# Patient Record
Sex: Female | Born: 1965 | Race: Black or African American | Hispanic: No | Marital: Single | Smoking: Never smoker
Health system: Southern US, Community
[De-identification: ages and names within clinical notes are randomized; demographics above are authoritative.]

## PROBLEM LIST (undated history)

## (undated) DIAGNOSIS — G459 Transient cerebral ischemic attack, unspecified: Secondary | ICD-10-CM

## (undated) DIAGNOSIS — E049 Nontoxic goiter, unspecified: Secondary | ICD-10-CM

## (undated) DIAGNOSIS — J45909 Unspecified asthma, uncomplicated: Secondary | ICD-10-CM

## (undated) HISTORY — PX: EYE SURGERY: SHX253

## (undated) HISTORY — PX: BREAST CYST EXCISION: SHX579

## (undated) HISTORY — PX: ABLATION: SHX5711

## (undated) HISTORY — PX: TUBAL LIGATION: SHX77

---
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
## 2004-02-23 ENCOUNTER — Encounter (INDEPENDENT_AMBULATORY_CARE_PROVIDER_SITE_OTHER): Payer: Self-pay | Admitting: Internal Medicine

## 2004-02-23 ENCOUNTER — Ambulatory Visit (HOSPITAL_COMMUNITY): Admission: RE | Admit: 2004-02-23 | Discharge: 2004-02-23 | Payer: Self-pay | Admitting: Obstetrics

## 2004-07-24 ENCOUNTER — Emergency Department (HOSPITAL_COMMUNITY): Admission: EM | Admit: 2004-07-24 | Discharge: 2004-07-24 | Payer: Self-pay | Admitting: Emergency Medicine

## 2005-02-08 ENCOUNTER — Emergency Department (HOSPITAL_COMMUNITY): Admission: EM | Admit: 2005-02-08 | Discharge: 2005-02-08 | Payer: Self-pay | Admitting: Emergency Medicine

## 2006-06-28 ENCOUNTER — Emergency Department (HOSPITAL_COMMUNITY): Admission: EM | Admit: 2006-06-28 | Discharge: 2006-06-28 | Payer: Self-pay | Admitting: Emergency Medicine

## 2007-04-02 ENCOUNTER — Emergency Department (HOSPITAL_COMMUNITY): Admission: EM | Admit: 2007-04-02 | Discharge: 2007-04-02 | Payer: Self-pay | Admitting: Family Medicine

## 2007-07-23 ENCOUNTER — Inpatient Hospital Stay (HOSPITAL_COMMUNITY): Admission: EM | Admit: 2007-07-23 | Discharge: 2007-07-26 | Payer: Self-pay | Admitting: Emergency Medicine

## 2007-07-23 ENCOUNTER — Ambulatory Visit: Payer: Self-pay | Admitting: Infectious Disease

## 2007-07-24 ENCOUNTER — Encounter: Payer: Self-pay | Admitting: Infectious Disease

## 2007-07-26 ENCOUNTER — Encounter: Payer: Self-pay | Admitting: Infectious Disease

## 2007-08-09 ENCOUNTER — Ambulatory Visit: Payer: Self-pay | Admitting: *Deleted

## 2007-08-09 DIAGNOSIS — R0602 Shortness of breath: Secondary | ICD-10-CM | POA: Insufficient documentation

## 2007-08-09 DIAGNOSIS — E785 Hyperlipidemia, unspecified: Secondary | ICD-10-CM | POA: Insufficient documentation

## 2007-11-18 ENCOUNTER — Encounter (INDEPENDENT_AMBULATORY_CARE_PROVIDER_SITE_OTHER): Payer: Self-pay | Admitting: Internal Medicine

## 2007-11-18 ENCOUNTER — Ambulatory Visit: Payer: Self-pay | Admitting: *Deleted

## 2007-11-18 ENCOUNTER — Ambulatory Visit (HOSPITAL_COMMUNITY): Admission: RE | Admit: 2007-11-18 | Discharge: 2007-11-18 | Payer: Self-pay | Admitting: *Deleted

## 2007-11-18 DIAGNOSIS — R079 Chest pain, unspecified: Secondary | ICD-10-CM

## 2007-11-18 DIAGNOSIS — F411 Generalized anxiety disorder: Secondary | ICD-10-CM

## 2008-04-14 ENCOUNTER — Ambulatory Visit: Payer: Self-pay | Admitting: Internal Medicine

## 2008-04-14 ENCOUNTER — Encounter (INDEPENDENT_AMBULATORY_CARE_PROVIDER_SITE_OTHER): Payer: Self-pay | Admitting: Internal Medicine

## 2008-04-14 ENCOUNTER — Encounter: Payer: Self-pay | Admitting: Internal Medicine

## 2008-04-14 DIAGNOSIS — M549 Dorsalgia, unspecified: Secondary | ICD-10-CM | POA: Insufficient documentation

## 2008-04-14 DIAGNOSIS — N912 Amenorrhea, unspecified: Secondary | ICD-10-CM

## 2008-04-17 LAB — CONVERTED CEMR LAB
Cholesterol: 117 mg/dL (ref 0–200)
HDL: 48 mg/dL (ref >39)
LDL Cholesterol: 56 mg/dL (ref 0–99)
Triglycerides: 65 mg/dL (ref <150)
VLDL: 13 mg/dL (ref 0–40)

## 2008-05-25 ENCOUNTER — Ambulatory Visit: Payer: Self-pay | Admitting: Internal Medicine

## 2008-05-25 ENCOUNTER — Telehealth: Payer: Self-pay | Admitting: *Deleted

## 2008-05-25 DIAGNOSIS — R109 Unspecified abdominal pain: Secondary | ICD-10-CM

## 2009-02-14 ENCOUNTER — Emergency Department (HOSPITAL_COMMUNITY): Admission: EM | Admit: 2009-02-14 | Discharge: 2009-02-14 | Payer: Self-pay | Admitting: Emergency Medicine

## 2009-09-04 ENCOUNTER — Ambulatory Visit: Payer: Self-pay | Admitting: Internal Medicine

## 2009-09-04 DIAGNOSIS — R609 Edema, unspecified: Secondary | ICD-10-CM | POA: Insufficient documentation

## 2009-09-04 DIAGNOSIS — Z862 Personal history of diseases of the blood and blood-forming organs and certain disorders involving the immune mechanism: Secondary | ICD-10-CM

## 2009-09-13 ENCOUNTER — Ambulatory Visit (HOSPITAL_COMMUNITY): Admission: RE | Admit: 2009-09-13 | Discharge: 2009-09-13 | Payer: Self-pay | Admitting: Internal Medicine

## 2009-12-27 ENCOUNTER — Emergency Department (HOSPITAL_COMMUNITY): Admission: EM | Admit: 2009-12-27 | Discharge: 2009-12-27 | Payer: Self-pay | Admitting: Emergency Medicine

## 2010-02-21 ENCOUNTER — Emergency Department (HOSPITAL_COMMUNITY)
Admission: EM | Admit: 2010-02-21 | Discharge: 2010-02-21 | Payer: Self-pay | Source: Home / Self Care | Admitting: Family Medicine

## 2010-02-21 LAB — CBC
HCT: 40.4 % (ref 36.0–46.0)
Hemoglobin: 13.1 g/dL (ref 12.0–15.0)
MCH: 30.8 pg (ref 26.0–34.0)
MCHC: 32.4 g/dL (ref 30.0–36.0)
MCV: 95.1 fL (ref 78.0–100.0)
Platelets: 252 10*3/uL (ref 150–400)
RBC: 4.25 MIL/uL (ref 3.87–5.11)
RDW: 11.8 % (ref 11.5–15.5)
WBC: 5.1 10*3/uL (ref 4.0–10.5)

## 2010-02-21 LAB — POCT I-STAT, CHEM 8
BUN: <3 mg/dL — ABNORMAL LOW (ref 6–23)
Calcium, Ion: 1.34 mmol/L — ABNORMAL HIGH (ref 1.12–1.32)
Chloride: 103 mEq/L (ref 96–112)
Creatinine, Ser: 0.9 mg/dL (ref 0.4–1.2)
Glucose, Bld: 80 mg/dL (ref 70–99)
HCT: 45 % (ref 36.0–46.0)
Hemoglobin: 15.3 g/dL — ABNORMAL HIGH (ref 12.0–15.0)
Potassium: 4.2 mEq/L (ref 3.5–5.1)
Sodium: 139 mEq/L (ref 135–145)
TCO2: 30 mmol/L (ref 0–100)

## 2010-02-21 LAB — DIFFERENTIAL
Basophils Absolute: 0 10*3/uL (ref 0.0–0.1)
Basophils Relative: 1 % (ref 0–1)
Eosinophils Absolute: 0.2 10*3/uL (ref 0.0–0.7)
Eosinophils Relative: 4 % (ref 0–5)
Lymphocytes Relative: 34 % (ref 12–46)
Lymphs Abs: 1.7 10*3/uL (ref 0.7–4.0)
Monocytes Absolute: 0.4 10*3/uL (ref 0.1–1.0)
Monocytes Relative: 8 % (ref 3–12)
Neutro Abs: 2.8 10*3/uL (ref 1.7–7.7)
Neutrophils Relative %: 53 % (ref 43–77)

## 2010-02-21 LAB — SEDIMENTATION RATE: Sed Rate: 7 mm/hr (ref 0–22)

## 2010-02-21 LAB — TSH: TSH: 1.57 u[IU]/mL (ref 0.350–4.500)

## 2010-02-21 LAB — T4: T4, Total: 9.9 ug/dL (ref 5.0–12.5)

## 2010-02-22 ENCOUNTER — Encounter: Payer: Self-pay | Admitting: Internal Medicine

## 2010-02-22 ENCOUNTER — Inpatient Hospital Stay (HOSPITAL_COMMUNITY)
Admission: EM | Admit: 2010-02-22 | Discharge: 2010-02-26 | Payer: Self-pay | Source: Home / Self Care | Attending: Internal Medicine | Admitting: Internal Medicine

## 2010-02-22 LAB — DIFFERENTIAL
Basophils Absolute: 0 10*3/uL (ref 0.0–0.1)
Basophils Relative: 0 % (ref 0–1)
Eosinophils Absolute: 0.2 10*3/uL (ref 0.0–0.7)
Eosinophils Relative: 3 % (ref 0–5)
Lymphocytes Relative: 16 % (ref 12–46)
Lymphs Abs: 1.1 10*3/uL (ref 0.7–4.0)
Monocytes Absolute: 0.6 10*3/uL (ref 0.1–1.0)
Monocytes Relative: 9 % (ref 3–12)
Neutro Abs: 4.9 10*3/uL (ref 1.7–7.7)
Neutrophils Relative %: 73 % (ref 43–77)

## 2010-02-22 LAB — BASIC METABOLIC PANEL
BUN: 7 mg/dL (ref 6–23)
CO2: 27 mEq/L (ref 19–32)
Calcium: 8.6 mg/dL (ref 8.4–10.5)
Chloride: 102 mEq/L (ref 96–112)
Creatinine, Ser: 1.09 mg/dL (ref 0.4–1.2)
GFR calc Af Amer: >60 mL/min (ref >60)
GFR calc non Af Amer: 55 mL/min — ABNORMAL LOW (ref >60)
Glucose, Bld: 92 mg/dL (ref 70–99)
Potassium: 3.9 mEq/L (ref 3.5–5.1)
Sodium: 137 mEq/L (ref 135–145)

## 2010-02-22 LAB — CBC
HCT: 37.4 % (ref 36.0–46.0)
Hemoglobin: 12.1 g/dL (ref 12.0–15.0)
MCH: 30.8 pg (ref 26.0–34.0)
MCHC: 32.4 g/dL (ref 30.0–36.0)
MCV: 95.2 fL (ref 78.0–100.0)
Platelets: 200 10*3/uL (ref 150–400)
RBC: 3.93 MIL/uL (ref 3.87–5.11)
RDW: 11.7 % (ref 11.5–15.5)
WBC: 6.7 10*3/uL (ref 4.0–10.5)

## 2010-02-22 LAB — TSH: TSH: 1.171 u[IU]/mL (ref 0.350–4.500)

## 2010-02-22 LAB — SEDIMENTATION RATE: Sed Rate: 7 mm/hr (ref 0–22)

## 2010-02-22 LAB — T4, FREE: Free T4: 1.04 ng/dL (ref 0.80–1.80)

## 2010-02-22 LAB — T4: T4, Total: 8.1 ug/dL (ref 5.0–12.5)

## 2010-02-26 ENCOUNTER — Ambulatory Visit: Admit: 2010-02-26 | Payer: Self-pay

## 2010-02-26 ENCOUNTER — Encounter: Payer: Self-pay | Admitting: Ophthalmology

## 2010-02-26 DIAGNOSIS — E049 Nontoxic goiter, unspecified: Secondary | ICD-10-CM | POA: Insufficient documentation

## 2010-03-04 LAB — BASIC METABOLIC PANEL
BUN: 5 mg/dL — ABNORMAL LOW (ref 6–23)
BUN: 3 mg/dL — ABNORMAL LOW (ref 6–23)
CO2: 27 mEq/L (ref 19–32)
CO2: 26 mEq/L (ref 19–32)
Calcium: 7.7 mg/dL — ABNORMAL LOW (ref 8.4–10.5)
Calcium: 8.3 mg/dL — ABNORMAL LOW (ref 8.4–10.5)
Chloride: 104 mEq/L (ref 96–112)
Chloride: 103 mEq/L (ref 96–112)
Creatinine, Ser: 0.98 mg/dL (ref 0.4–1.2)
Creatinine, Ser: 0.84 mg/dL (ref 0.4–1.2)
GFR calc Af Amer: >60 mL/min (ref >60)
GFR calc Af Amer: >60 mL/min (ref >60)
GFR calc non Af Amer: >60 mL/min (ref >60)
GFR calc non Af Amer: >60 mL/min (ref >60)
Glucose, Bld: 90 mg/dL (ref 70–99)
Glucose, Bld: 119 mg/dL — ABNORMAL HIGH (ref 70–99)
Potassium: 3.7 mEq/L (ref 3.5–5.1)
Potassium: 3.6 mEq/L (ref 3.5–5.1)
Sodium: 138 mEq/L (ref 135–145)
Sodium: 137 mEq/L (ref 135–145)

## 2010-03-04 LAB — CBC
HCT: 32.7 % — ABNORMAL LOW (ref 36.0–46.0)
HCT: 34.2 % — ABNORMAL LOW (ref 36.0–46.0)
Hemoglobin: 10.7 g/dL — ABNORMAL LOW (ref 12.0–15.0)
Hemoglobin: 11 g/dL — ABNORMAL LOW (ref 12.0–15.0)
MCH: 30.9 pg (ref 26.0–34.0)
MCH: 30.1 pg (ref 26.0–34.0)
MCHC: 32.7 g/dL (ref 30.0–36.0)
MCHC: 32.2 g/dL (ref 30.0–36.0)
MCV: 94.5 fL (ref 78.0–100.0)
MCV: 93.7 fL (ref 78.0–100.0)
Platelets: 203 10*3/uL (ref 150–400)
Platelets: 214 10*3/uL (ref 150–400)
RBC: 3.46 MIL/uL — ABNORMAL LOW (ref 3.87–5.11)
RBC: 3.65 MIL/uL — ABNORMAL LOW (ref 3.87–5.11)
RDW: 11.7 % (ref 11.5–15.5)
RDW: 11.3 % — ABNORMAL LOW (ref 11.5–15.5)
WBC: 5.6 10*3/uL (ref 4.0–10.5)
WBC: 3.5 10*3/uL — ABNORMAL LOW (ref 4.0–10.5)

## 2010-03-04 LAB — HIV ANTIBODY (ROUTINE TESTING W REFLEX): HIV: NONREACTIVE

## 2010-03-05 ENCOUNTER — Other Ambulatory Visit
Admission: RE | Admit: 2010-03-05 | Discharge: 2010-03-05 | Payer: Self-pay | Source: Home / Self Care | Admitting: Otolaryngology

## 2010-03-05 ENCOUNTER — Other Ambulatory Visit: Payer: Self-pay | Admitting: Otolaryngology

## 2010-03-12 ENCOUNTER — Ambulatory Visit: Admit: 2010-03-12 | Payer: Self-pay

## 2010-03-19 NOTE — Assessment & Plan Note (Signed)
Summary: REASSIGNED NEW TO DR/CFB   Vital Signs:  Patient profile:   45 year old female Height:      68 inches (172.72 cm) Weight:      155.2 pounds (70.55 kg) BMI:     23.68 Temp:     98.2 degrees F oral Pulse rate:   66 / minute BP sitting:   112 / 73  (right arm)  Vitals Entered By: Chinita Pester RN (September 04, 2009 3:19 PM) CC: Regular check-up. Check bruises on legs; stays cold;ankle edema. Is Patient Diabetic? No Pain Assessment Patient in pain? no      Nutritional Status BMI of 19 -24 = normal  Have you ever been in a relationship where you felt threatened, hurt or afraid?No   Does patient need assistance? Functional Status Self care Ambulation Normal   Primary Care Provider:  Joaquin Courts  MD  CC:  Regular check-up. Check bruises on legs; stays cold;ankle edema..  History of Present Illness: 1. c/o increase bruising to her legs for 4-5 months. Patient does not report similar Sx in the past. No personal or FMHx of blood disorders. Denies sweats, weight loss, fatigue orany other Sx. 2. C/o ankle swelling, intermittent for 5 months; worse while at work with prolonged sitting. Denies any SOB, CP,palpitations,snoring, fever, chills or recent trips.  Depression History:      The patient denies a depressed mood most of the day and a diminished interest in her usual daily activities.         Preventive Screening-Counseling & Management  Alcohol-Tobacco     Alcohol drinks/day: rarely     Smoking Status: never  Caffeine-Diet-Exercise     Caffeine use/day: 2 cups coffee/day     Does Patient Exercise: yes- sometimes     Type of exercise: SWIM   / WALKING     Times/week: <3  Problems Prior to Update: 1)  Screening For Malignant Neoplasm of The Cervix  (ICD-V76.2) 2)  Anemia, Mild, Hx of  (ICD-V12.3) 3)  Leg Edema, Bilateral  (ICD-782.3) 4)  Pelvic Pain, Left  (ICD-789.09) 5)  Back Pain  (ICD-724.5) 6)  Amenorrhea  (ICD-626.0) 7)  Other Anxiety States   (ICD-300.09) 8)  Chest Pain  (ICD-786.50) 9)  Sob  (ICD-786.05) 10)  Hyperlipidemia  (ICD-272.4)  Current Problems (verified): 1)  Pelvic Pain, Left  (ICD-789.09) 2)  Back Pain  (ICD-724.5) 3)  Amenorrhea  (ICD-626.0) 4)  Other Anxiety States  (ICD-300.09) 5)  Chest Pain  (ICD-786.50) 6)  Sob  (ICD-786.05) 7)  Hyperlipidemia  (ICD-272.4)  Medications Prior to Update: 1)  Aspirin Adult Low Strength 81 Mg  Tbec (Aspirin) .... One Tablet By Mouth Daily. 2)  Klonopin 0.5 Mg  Tabs (Clonazepam) .... One Tablet By Mouth At Bedtime. 3)  One-A-Day Womens Prenatal 28-0.8 & 440 Mg Misc (Prenatal Vit-Fe Fum-Fa-Omega) .... Take 1 Tablet By Mouth Once A Day 4)  Multivitamins   Tabs (Multiple Vitamin) 5)  Ventolin Hfa 108 (90 Base) Mcg/act  Aers (Albuterol Sulfate) .... One Puff Every Four Hours As Needed. 6)  Oxycodone Hcl 5 Mg Tabs (Oxycodone Hcl) 7)  Ibu 800 Mg Tabs (Ibuprofen) 8)  Benefiber  Tabs (Wheat Dextrin) 9)  Percocet 5-325 Mg Tabs (Oxycodone-Acetaminophen) .... Take One Tablet By Mouth Every Six Hours As Needed  Current Medications (verified): 1)  One-A-Day Womens Prenatal 28-0.8 & 440 Mg Misc (Prenatal Vit-Fe Fum-Fa-Omega) .... Take 1 Tablet By Mouth Once A Day 2)  Jobst Knee High Compression Sm  Misc (Elastic Bandages & Supports) .... Use While At Work or Immobile  Allergies (verified): No Known Drug Allergies  Directives (verified): 1)  Full Code   Past History:  Past Medical History: Last updated: 11/18/2007 Hospitalized 06/09 for chest pain and neuro symptoms: Borderline EF on ECHO of 50-55%, neg CE's, neg tele.  F/u w/ Dr. Anne Fu revealed normal treadmill testing and no further testing indicated. Hx of asthma and bronchitis Hyperlipidemia Benign breast cyst  Past Surgical History: Last updated: 08/09/2007 Breast lumpectomy-benign BTL 17 years ago.  Risk Factors: Smoking Status: never (09/04/2009)  Family History: Reviewed history from 08/09/2007 and no changes  required. Son-unknown heart condition s/p transplant at 14 Mom-DM Dad-DM, htn, no hx of MI, CVA Sisters-No known problems Paternal side:lung CA;pancreatic cancer; brain cancer; breast cancer.  Social History: Reviewed history from 08/09/2007 and no changes required. No smoking, illegal drugs, one glass of gin/juice a week, works for [employer], divorced, lives with son.Does Patient Exercise:  yes- sometimes Caffeine use/day:  2 cups coffee/day  Review of Systems       per HPI  Physical Exam  General:  alert, well-developed, well-nourished, and well-hydrated.   Head:  Normocephalic and atraumatic without obvious abnormalities. No apparent alopecia or balding. Eyes:  No corneal or conjunctival inflammation noted. EOMI. Perrla. Funduscopic exam benign, without hemorrhages, exudates or papilledema. Vision grossly normal. Ears:  External ear exam shows no significant lesions or deformities.  Otoscopic examination reveals clear canals, tympanic membranes are intact bilaterally without bulging, retraction, inflammation or discharge. Hearing is grossly normal bilaterally. Nose:  External nasal examination shows no deformity or inflammation. Nasal mucosa are pink and moist without lesions or exudates. Mouth:  Oral mucosa and oropharynx without lesions or exudates.  Teeth in good repair. Neck:  No deformities, masses, or tenderness noted. Lungs:  Normal respiratory effort, chest expands symmetrically. Lungs are clear to auscultation, no crackles or wheezes. Heart:  Normal rate and regular rhythm. S1 and S2 normal without gallop, murmur, click, rub or other extra sounds. Abdomen:  Bowel sounds positive,abdomen soft and non-tender without masses, organomegaly or hernias noted. Msk:  No deformity or scoliosis noted of thoracic or lumbar spine.   Pulses:  R and L carotid,radial,femoral,dorsalis pedis and posterior tibial pulses are full and equal bilaterally Extremities:  1+ left pedal edema and 1+  right pedal edema.  No calf TTP bilaterally Neurologic:  No cranial nerve deficits noted. Station and gait are normal. Plantar reflexes are down-going bilaterally. DTRs are symmetrical throughout. Sensory, motor and coordinative functions appear intact. Skin:  Left UE ventral surface 0.8 cm in diameter round, hyperpigmented, papular lesion noted. Per patient, has had it for 6 months --no puriruts, no expansion, no pain. Cervical Nodes:  No lymphadenopathy noted Axillary Nodes:  No palpable lymphadenopathy Inguinal Nodes:  No significant adenopathy Psych:  Cognition and judgment appear intact. Alert and cooperative with normal attention span and concentration. No apparent delusions, illusions, hallucinations   Impression & Recommendations:  Problem # 1:  ANEMIA, MILD, HX OF (ICD-V12.3) Likely 2/2DUB. Patient is sp endometrial ablation.  Orders: T-CBC No Diff ([id number])  Problem # 2:  LEG EDEMA, BILATERAL (ICD-782.3)  Likely 2/2 venous insufficiency. Leg exercises demonstrated; compression knee highs and foot stool use while at work. Instructed to RTC if no improvment or any other Sx such as orthopnea, PND, CP or palpitations. Patient sees a cardiologist once a year given strong FMHx of CAD. She has an appointment for August, 2011. Instructed to address the matter  at that time as well. No objective findings of ecchymoses. Might be related to pedal edema. Will check CBC. Considerfull work up if problem recurrs orpersists. Patient does not have any FMHx of blood disorders.  Discussed elevation of the legs, use of compression stockings, sodium restiction, and medication use.   Problem # 3:  HYPERLIPIDEMIA (ICD-272.4) Diet, exercise discussed. Orders: T-Lipid Profile ([id number])    HDL:48 (04/14/2008)  LDL:56 (04/14/2008)  Chol:117 (04/14/2008)  Trig:65 (04/14/2008)  Problem # 4:  Preventive Health Care (ICD-V70.0) Mammogram, PAP , lipids, TSH, C-met ordered. Wear seat belt  Complete  Medication List: 1)  One-a-day Womens Prenatal 28-0.8 & 440 Mg Misc (Prenatal vit-fe fum-fa-omega) .... Take 1 tablet by mouth once a day 2)  Jobst Knee High Compression Sm Misc (Elastic bandages & supports) .... Use while at work or immobile  Other Orders: T-Comprehensive Metabolic Panel [id number]) T-TSH [id number]) Mammogram (Screening) (Mammo) Gynecologic Referral (Gyn) TD Toxoids IM 7 YR + (62952) Admin 1st Vaccine (84132)  Patient Instructions: 1)  Please, fol,low up with your cardiologist and discuss "water pill use" on as needed basis. 2)  If no improvement in leg swelling, please call. 3)  Call with any questions. 4)  Return fasting for blood work. 5)  Nurse will call you with your referral information. Prescriptions: JOBST KNEE HIGH COMPRESSION SM  MISC (ELASTIC BANDAGES & SUPPORTS) Use while at work or immobile  #1 x 3   Entered and Authorized by:   Deatra Robinson MD   Signed by:   Deatra Robinson MD on 09/04/2009   Method used:   Print then Give to Patient   RxID:   [id number]   Prevention & Chronic Care Immunizations   Influenza vaccine: Not documented   Influenza vaccine deferral: Deferred  (09/04/2009)   Influenza vaccine due: 10/18/2009    Tetanus booster: 09/04/2009: Td   Tetanus booster due: 09/05/2019    Pneumococcal vaccine: Not documented  Other Screening   Pap smear: Not documented   Pap smear action/deferral: GYN Referral  (09/04/2009)   Pap smear due: 09/04/2012    Mammogram: Not documented   Mammogram action/deferral: Ordered  (09/04/2009)   Smoking status: never  (09/04/2009)  Lipids   Total Cholesterol: 117  (04/14/2008)   LDL: 56  (04/14/2008)   LDL Direct: Not documented   HDL: 48  (04/14/2008)   Triglycerides: 65  (04/14/2008)    SGOT (AST): Not documented   SGPT (ALT): Not documented CMP ordered    Alkaline phosphatase: Not documented   Total bilirubin: Not documented  Self-Management Support :   Personal Goals  (by the next clinic visit) :      Personal LDL goal: 100  (09/04/2009)    Patient will work on the following items until the next clinic visit to reach self-care goals:     Medications and monitoring: bring all of my medications to every visit  (09/04/2009)     Activity: take a 30 minute walk every day  (09/04/2009)    Lipid self-management support: Written self-care plan, Education handout, Resources for patients handout  (09/04/2009)   Lipid self-care plan printed.   Lipid education handout printed      Resource handout printed.   Nursing Instructions: Give tetanus booster today Gyn referral for screening Pap (see order) Schedule screening mammogram (see order)   Process Orders Check Orders Results:     [facility]: ABN not required for this insurance Tests Sent for requisitioning (September 04, 2009 4:52 PM):  09/04/2009: [facility] -- T-Comprehensive Metabolic Panel [[id number]] (signed)     09/04/2009: [facility] -- T-Lipid Profile [phone] (signed)     09/04/2009: [facility] -- T-CBC No Diff [[id number]] (signed)     09/04/2009: [facility] -- T-TSH [phone] (signed)     Tetanus/Td Vaccine    Vaccine Type: Td    Site: right deltoid    Mfr: Sanofi Pasteur    Dose: 0.5 ml    Route: IM    Given by: Chinita Pester RN    Exp. Date: 03/15/2011    Lot #: [id number]    VIS given: 01/05/07 version given September 04, 2009.

## 2010-03-19 NOTE — Letter (Signed)
Summary: MAMMOGRAM  MAMMOGRAM   Imported By: Margie Billet 06/21/2009 11:27:43  _____________________________________________________________________  External Attachment:    Type:   Image     Comment:   External Document

## 2010-03-21 NOTE — Assessment & Plan Note (Signed)
Summary: Hospital Admission: 02/22/2010 - Dysphagia, pain in the neck  INTERNAL MEDICINE ADMISSION HISTORY AND PHYSICAL ATTENDING: DR. Onalee Hua R1- DR. Rhona Raider R3 - DR. [facility] [phone]  PCP: DR. Denton Meek  CC: PAIN IN THE NECK AND ODYNOPHAGIA  HPI: 45 yo female with no significant PMH presents to [facility] ED with main concern of 2-3 day history of painful neck associated with odynophagia to liquids and solids. She has first noticed symptoms while at church and thought it was going to improve for which reason she cont to go to work. She felt worse at work and went to urgent care yeaterday. The pain is located all around her nech, 10/10 in severity, nonradiating, constant, knife-like, aggrevated by any neck movement and with no specific alleviating factors. She has not had similar episodes in the past and denies any new medications on her profile, only take MVI. This is assocaited with painful swallowing, solids and liquids, for which reason she has been refusing to eat or drink over the past day. In urgent care they have given her pain medication and abx and told her to go home and complete the regimen. Pain medicine helped a little but she has not started abx due to her fear of trying to swallow more than she needs. She also reports chest pain worse with inspiration, nonradiating. In addition, she reports having flu like symptoms which lasted about 1 week and she felt better after that. Her fiance got alos sick with flu around the same time. She denies any SOB, no dysphagia or feeling of food being stuck in her throat, no sinus tenderness, no headaches or visual changes, no other systemic symptoms, no night sweats, no weight loss or gain, no heat or cold intolerance, no abdominal or urinary concerns,no blood in urine or stool, no weakness.  ALLERGIES: NKDA  PAST MEDICAL HISTORY: Hospitalized 06/09 for chest pain and neuro symptoms: Borderline EF on ECHO of 50-55%, neg CE's, neg tele.  F/u w/ Dr. Anne Fu  revealed normal treadmill testing and no further testing indicated. Hx of asthma and bronchitis Hyperlipidemia Benign breast cyst  MEDICATIONS: MVI ONCE DAILY  SOCIAL HISTORY: No smoking, illegal drugs, one glass of gin/juice a week, works for [employer], divorced, lives with son.  FAMILY HISTORY: Son-unknown heart condition s/p transplant at 62 Mom-DM Dad-DM, htn, no hx of MI, CVA Sisters-No known problems Paternal side:lung CA;pancreatic cancer; brain cancer; breast cancer.  ROS: PER HPI  VITALS: T:  98.6 f P: 117/MIN --> 79/MIN  BP: 130/10mmHg --> 84/57 mmHg   R: 16/MIN   O2SAT: 96% ON: RA  PHYSICAL EXAM: General:  alert, well-developed, well-nourished, and well-hydrated, in moderate distress due to pain, crying Head:  Normocephalic and atraumatic without obvious abnormalities. No apparent alopecia or balding. Eyes:  No corneal or conjunctival inflammation noted. EOMI. PERRLA. Funduscopic exam benign, without hemorrhages, exudates or papilledema. Vision grossly normal. Ears:  External ear exam shows no significant lesions or deformities.  Otoscopic examination reveals clear canals, tympanic membranes are intact bilaterally without bulging, retraction, inflammation or discharge. Hearing is grossly normal bilaterally. Nose:  External nasal examination shows no deformity or inflammation. Nasal mucosa are pink and moist without lesions or exudates. Mouth:  Oral mucosa and oropharynx without lesions or exudates or erythema.  Good dental care, dry MM Neck:  generalized neck tenderness and swelling, left > right, no bruits appreciated, unable to complete exam due to patient's pain Lungs:  Normal respiratory effort, chest expands symmetrically. Lungs are clear to auscultation, no  crackles or wheezes. Heart:  Normal rate and regular rhythm. S1 and S2 normal without gallop, murmur, click, rub or other extra sounds. Off note was tachycardic earlier. Abdomen:  Bowel sounds positive,abdomen  soft and non-tender without masses, organomegaly or hernias noted. Msk:  No deformity or scoliosis noted of thoracic or lumbar spine.   Pulses:  R and L carotid,radial,femoral,dorsalis pedis and posterior tibial pulses are full and equal bilaterally Extremities:  1+ left pedal edema and 1+ right pedal edema.  No calf TTP bilaterally Neurologic:  No cranial nerve deficits noted. Station and gait are normal. Plantar reflexes are down-going bilaterally. DTRs are symmetrical throughout. Sensory, motor and coordinative functions appear intact. Skin:  Left UE ventral surface 0.8 cm in diameter round, hyperpigmented, papular lesion noted. Per patient, has had it for 6 months --no puriruts, no expansion, no pain. Cervical Nodes:  Unable to examine thoroughly due to patient's pain Axillary Nodes:  No palpable lymphadenopathy Inguinal Nodes:  No significant adenopathy Psych:  Cognition and judgment appear intact. Alert and cooperative with normal attention span and concentration. No apparent delusions, illusions, hallucinations  LABS:  NA                  137              K               3.9              Cl                 102             CO2                 27                Glu                 92                BUN                 7              Cr                1.09               Ca               8.6  WBC       6.7                           HEMOGLOBIN         12.1                 HEMATOCRIT         37.4                MCV                 95.2               PLATELET          200                ESR        7 TSH        1.570 T4 (total)     9.9 (5-12.5)  CXR (  02/21/2010)   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  No definite acute   abnormality apparent.  CT NECK WITH CONTRAST (02/21/2010)   IMPRESSION:   1.  Infiltrative low density in the retropharyngeal  space and right   carotid sheath is nonspecific.  And while the deep space neck   infection might have this appearance, this case lacks other   commonly associated findings such as tonsillar hypertrophy, soft   tissue stranding, and widespread lymphadenopathy.  No definite   involvement the mediastinum at this time.  No  acute vascular   abnormality identified.   2.  Minimal right level IIA lymphadenopathy.   3.  Small pleural effusions with mild pulmonary atelectasis.   4.  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  Recommend nonemergent   followup ultrasound-guided FNA.   ASSESSMENT AND PLAN:  (1) Neck pain and odynophagia - unclear etiology at the time, given pt's history of present illness, recent flu and diffuse tenderness in entire neck area - qestionable viral thyroiditis. She is afebrile and has no leukocytosis but was tachycardic on initial presentation with soft BP so I am not quite clear if this is infectious in etiology. CT of the neck is not suggestive of abscess. Her TSH is normal and total T4 but the TFT can be normal in case of thyroiditis. Will call ENT consult and follow up on recommendations. I will  hold off on abx for now since I am not convinvced this is bacterial in etiology but if her condition deteriorates we can consider that option and we can also give steroids. Pain control with morphine and IVF.   (2) VTE PROPH: lovenox 40 mg subq daily

## 2010-03-21 NOTE — Discharge Summary (Signed)
Summary: Hospital Discharge Update    Hospital Discharge Update:  Date of Admission: 02/22/2010 Date of Discharge: 02/26/2010  Brief Summary:  This is a 45 year old female who presented with neck swelling and tenderness felt to be consistant with a post-viral thryroiditis.  However, CT scan demonstrated an infiltrative low density in the retropharyngeal space and right carotid sheath concerning for possible retropharyngeal cellulitis.  Pt was treated wtih unasyn and discharged with 10 days of augmenten.  On the day of discharge, pt continued to have mild odynophagia but no difficulty swallowing.  Labs needed at follow-up: CBC with differential  Other labs needed at follow-up: Please check TSH, and free T4.  Other follow-up issues:  Pt has a  26 x 33 x 38 mm thyroid mass that needs follow up with FNA.  Pt had normal HGB on admission which dropped likely secondary to fluid administration.  Problem list changes:  Added new problem of Question of  ACUTE THYROIDITIS (ICD-245.0) Added new problem of THYROID MASS (ICD-240.9)  Medication list changes:  Added new medication of AUGMENTIN 500-125 MG TABS (AMOXICILLIN-POT CLAVULANATE) Take 1 tablet by mouth two times a day for 10 days - Signed Added new medication of TYLENOL EXTRA STRENGTH 500 MG TABS (ACETAMINOPHEN) Take 1 tab every 6 hours as needed for pain.  Do not take more than 2500mg  daily. Rx of AUGMENTIN 500-125 MG TABS (AMOXICILLIN-POT CLAVULANATE) Take 1 tablet by mouth two times a day for 10 days;  #20 x 0;  Signed;  Entered by: Sinda Du MD;  Authorized by: Sinda Du MD;  Method used: Print then Give to Patient  Discharge medications:  ONE-A-DAY WOMENS PRENATAL 28-0.8 & 440 MG MISC (PRENATAL VIT-FE FUM-FA-OMEGA) Take 1 tablet by mouth once a day JOBST KNEE HIGH COMPRESSION SM  MISC (ELASTIC BANDAGES & SUPPORTS) Use while at work or immobile AUGMENTIN 500-125 MG TABS (AMOXICILLIN-POT CLAVULANATE) Take 1 tablet by mouth two  times a day for 10 days TYLENOL EXTRA STRENGTH 500 MG TABS (ACETAMINOPHEN) Take 1 tab every 6 hours as needed for pain.  Do not take more than 2500mg  daily.  Other patient instructions:  Please come for an appointment at the outpatient clinic at [facility] on 1/24 at 8 45 am with Dr. Arvilla Market for a followup visit.  Please follow up with Dr. Jenne Pane of [facility]  on 03/12/10  2:40 pm for a hospital follow up appointment. The address is [address]  Please bring a current list of medications, any copay and arrive 15 mins early.   If you develop worsening throat swelling, difficulty breathing or swallowing or have pain that is not controlled with OTC tylenol.  Please call the clinic at [phone] or go to the ED.   Please take your medication as prescribed below.  If you have any problem, Please call the clinic.   In case of an emergency  dial 911 or go to the emergency department.   Note: Hospital Discharge Medications & Other Instructions handout was printed, one copy for patient and a second copy to be placed in hospital chart.

## 2010-03-22 ENCOUNTER — Other Ambulatory Visit: Payer: Self-pay | Admitting: Otolaryngology

## 2010-03-22 DIAGNOSIS — E041 Nontoxic single thyroid nodule: Secondary | ICD-10-CM

## 2010-03-26 ENCOUNTER — Ambulatory Visit
Admission: RE | Admit: 2010-03-26 | Discharge: 2010-03-26 | Disposition: A | Discharge status: Home / Self Care | Payer: 59 | Source: Ambulatory Visit | Attending: Otolaryngology | Admitting: Otolaryngology

## 2010-03-26 ENCOUNTER — Other Ambulatory Visit: Payer: Self-pay | Admitting: Otolaryngology

## 2010-03-26 DIAGNOSIS — E041 Nontoxic single thyroid nodule: Secondary | ICD-10-CM

## 2010-04-10 NOTE — Consult Note (Signed)
NAME:  Susan Berry, Susan Berry NO.:  [id number]  MEDICAL RECORD NO.:  [id number]          PATIENT TYPE:  INP  LOCATION:  3037                         FACILITY:  [facility]  PHYSICIAN:  Antony Contras, MD     DATE OF BIRTH:  1965-11-19  DATE OF CONSULTATION:  02/22/2010 DATE OF DISCHARGE:                                CONSULTATION   REQUESTING SERVICE:  Teaching Service.  CHIEF COMPLAINT:  Throat pain.  HISTORY OF PRESENT ILLNESS:  The patient is a 45 year old African American female with a 2-day history of right neck/lower throat pain. The pain is worsening, severe, worsened by palpation and swallowing, associated with odynophagia and raspy hoarseness.  She had a feeling that her throat was closing up today and came to the emergency department.  She has been admitted to the hospital after being given Zosyn in the ER.  She had a CT scan as well.  She was seen at urgent care yesterday and given pain medication and a prescription for antibiotic but did not fill the antibiotic due to funding.  PAST MEDICAL HISTORY:  None.  PAST SURGICAL HISTORY: 1. Bilateral tubal ligation. 2. Endometrial ablation.  MEDICATIONS:  Tylenol with Codeine.  ALLERGIES:  No known drug allergies.  FAMILY HISTORY:  CVA and CHF.  SOCIAL HISTORY:  No smoking, drinking, or drug use.  REVIEW OF SYSTEMS:  Negative except as listed above.  PHYSICAL EXAMINATION:  VITAL SIGNS:  Afebrile.  Vital signs stable. GENERAL:  The patient seems somewhat uncomfortable but is pleasant and cooperative. VOICE:  Mildly hoarse with raspiness. BREATHING:  No stridor. EYES:  Extraocular movements intact.  Pupils are equal, round, reactive to light. EARS:  External ears are normal.  External canals are patent and normal. Tympanic membranes are intact.  Middle ear spaces are aerated. NOSE:  Normal external nose and nasal passages are patent with a relatively midline septum. ORAL CAVITY AND OROPHARYNX:  Lips,  teeth, and gums are normal.  The tongue and floor of mouth are normal.  The oropharynx is normal. SALIVARY GLANDS:  Normal to palpation. NECK:  The right lower anterior neck is tender to palpation without mass or deformity. THYROID:  The tenderness in the neck extends down to the right thyroid gland level.  There is a palpable left thyroid nodule that measures about 2 cm. LYMPHATICS:  There are no enlarged lymph nodes in the neck. HEARING:  Grossly normal. CRANIAL NERVES:  II through XII grossly intact.  LABORATORY DATA:  Sed rate 7 and white blood count 6.7.  RADIOLOGIC EXAMINATION:  A CT scan of the neck with contrast was personally reviewed and demonstrates edema of the right retropharyngeal space and carotid sheath region near the right thyroid gland with no obvious signs of abscess.  Also seen is a round nodule in the left thyroid gland.  ASSESSMENT:  The patient is a 45 year old African American female with a right retropharyngeal and carotid sheath edema and associated tenderness and also a left thyroid nodule.  PLAN:  I discussed the case with the patient and the primary team.  It is unusual to have an infection of  the retropharyngeal space with normal white blood count and normal  DICTATION ENDS AT THIS POINT.     Antony Contras, MD     DDB/MEDQ  D:  02/22/2010  T:  02/23/2010  Job:  [id number]  Electronically Signed by Christia Reading MD on 04/10/2010 05:45:35 PM

## 2010-04-10 NOTE — Consult Note (Signed)
NAME:  Susan Berry, Susan Berry NO.:  [id number]  MEDICAL RECORD NO.:  [id number]          PATIENT TYPE:  INP  LOCATION:  3037                         FACILITY:  [facility]  PHYSICIAN:  Antony Contras, MD     DATE OF BIRTH:  June 15, 1965  DATE OF CONSULTATION:  02/22/2010 DATE OF DISCHARGE:                                CONSULTATION   REQUESTING SERVICE:  Teaching Service.  CHIEF COMPLAINT:  Throat pain.  HISTORY OF PRESENT ILLNESS:  The patient is a 45 year old African American female with 2-day history of right lower neck/throat pain that has worsened, it is severe, it is worsened by palpation and swallowing, and associated with odynophagia and raspy hoarseness.  She has had no fever, but felt like her throat was closing somewhat today.  She went to Urgent Care yesterday and was given pain medication and a prescription for an antibiotic though she could not get the antibiotic filled due to funding.  Thus, she returned to the emergency room today with worsening symptoms and was admitted to the hospital after being given a dose of intravenous Zosyn in the emergency room.  PAST MEDICAL HISTORY:  None.  PAST SURGICAL HISTORY:  Tubal ligation and endometrial ablation.  MEDICATIONS:  Tylenol with Codeine.  ALLERGIES:  No known drug allergies.  FAMILY HISTORY:  CVA and CHF.  SOCIAL HISTORY:  No smoking, drinking, or drug use.  REVIEW OF SYSTEMS:  Negative except as above.  PHYSICAL EXAMINATION:  VITAL SIGNS:  Afebrile.  Vital signs stable. GENERAL:  The patient is in some discomfort but is pleasant and cooperative. EYES:  Extraocular movements intact.  Pupils equal, round, and reactive to light. EARS:  External ears are normal and external canals are patent. Tympanic membranes are intact.  The middle ear spaces are aerated. NOSE:  External nose is normal and nasal passages are patent with a relative midline septum. ORAL CAVITY/OROPHARYNX:  The lips, teeth, and gums  are normal.  The tongue and floor of mouth are normal.  The oropharynx is normal. NECK:  The right lower anterior neck is quite tender to palpation without mass or deformity. THYROID:  The tenderness extends down to the region of the right thyroid gland.  There is a palpable mass in the left thyroid gland that measures approximately 2 cm and is not tender. LYMPHATICS:  There are no enlarged lymph nodes in the neck. SALIVARY GLANDS:  Normal palpation. CRANIAL NERVES:  II-XII grossly intact. VOICE:  Mildly raspy no stridor on breathing. HEARING:  Grossly normal.  LABORATORY DATA:  White blood count 6.7 and ESR 7.  RADIOLOGY:  A neck CT with contrast was performed today and personally reviewed.  This demonstrates edema of the right retropharyngeal space and carotid sheath down to the right thyroid gland region without obvious abscess formation or surrounding inflammatory changes.  There is a round mass in the left thyroid gland as detailed in the report.  ASSESSMENT:  The patient is a 45 year old African American female with right retropharyngeal and carotid sheath edema and associated marked tenderness but with a normal white blood count, no fever, and normal sed rate.  She also has a thyroid nodule on the left side.  PLAN:  It is usual to have an infection in the neck without other inflammatory markers but I think this has to be presumed.  I talked with the primary team and the patient and we will start the patient on Unasyn intravenously.  I recommended scheduled Toradol as well and that this possibly could represent an inflammatory process and noninfectious.  I suggested that we see how she responds to medications over the next 48 hours and consider a repeat CT scan thereafter if she is not improving. The left thyroid nodule does not appear to play any role in her right neck pain and will need outpatient workup.     Antony Contras, MD     DDB/MEDQ  D:  02/22/2010  T:   02/23/2010  Job:  [id number]  Electronically Signed by Christia Reading MD on 04/10/2010 05:45:45 PM

## 2010-04-23 ENCOUNTER — Ambulatory Visit: Payer: Self-pay | Admitting: Internal Medicine

## 2010-04-24 ENCOUNTER — Telehealth: Payer: Self-pay | Admitting: *Deleted

## 2010-04-24 ENCOUNTER — Telehealth: Payer: Self-pay | Admitting: Internal Medicine

## 2010-04-24 NOTE — Telephone Encounter (Signed)
Are we certain that the phone tree is working? Any idea how the original appt was communicated with the pt? Postcard, tele, sch at last appt? We will need to continue to provide care for 30 days. She has every right to switch providers but will she follow through?

## 2010-04-24 NOTE — Telephone Encounter (Signed)
Admin. Note Re: Behavior Patient called yesterday to schedule an appointment.  CB told her that she had an appointment yesterday but NS.  Patient became very angry and cursed CB and the clinic.  I called her this am.  She was angry that we didn't call her to remind her and we didn't fill out her paperwork.  I explained that we have a computer that does it and since I called it appears the phone number is correct and her forms were waiting for her to be seen by a doctor.  She again became very angry and said "I'm going to tell you like I told her, I am going to get another doctor no way I'm going back there".  I confirmed that yes she was going to transfer her care and we would be expecting her release. She said "yes".  I suggest removing this pt. From Dr. Wendie Chess panel. Raynaldo Opitz Dir. [facility]

## 2010-04-25 NOTE — Telephone Encounter (Signed)
The phone tree call status report for call dates on March 1 shows that Ms. Gettinger's house was contacted and that the receiver was picked up and placed back down prior to either a person answering or a machine answering.  The status is "hung up early".  The call was made at 1745 to her home number listed in demographics.  Pt. Didn't deny having the apt. scheduled just that we didn't remind her. The apt. Was made by phone Jan. 27th at 1045 by Margie Billet so a postcard would not have been sent. Raynaldo Opitz Dir [facility]

## 2010-05-07 NOTE — Telephone Encounter (Signed)
Would you please send the patient a letter stating that we are following up on her statement that she is transferring care and that we will continue to provide care and refills for 30 days. And that we will also be more than willing to transfer her records to her new provider once she supplies [place] with the name of the provider and release.

## 2010-05-08 ENCOUNTER — Emergency Department (HOSPITAL_COMMUNITY)
Admission: EM | Admit: 2010-05-08 | Discharge: 2010-05-08 | Disposition: A | Discharge status: Home / Self Care | Payer: 59 | Attending: Emergency Medicine | Admitting: Emergency Medicine

## 2010-05-08 ENCOUNTER — Telehealth: Payer: Self-pay | Admitting: *Deleted

## 2010-05-08 DIAGNOSIS — R0789 Other chest pain: Secondary | ICD-10-CM | POA: Insufficient documentation

## 2010-05-08 NOTE — Telephone Encounter (Signed)
Letter sent to home of Ms. Susan Berry to f/u on her desire to transfer her care and we will provide phys. care and refills for 30 days.  Release required and future practice needed. Raynaldo Opitz

## 2010-05-20 LAB — CBC
HCT: 36.9 % (ref 36.0–46.0)
Hemoglobin: 12.2 g/dL (ref 12.0–15.0)
MCV: 96.4 fL (ref 78.0–100.0)
Platelets: 203 10*3/uL (ref 150–400)
RDW: 12.3 % (ref 11.5–15.5)
WBC: 5 10*3/uL (ref 4.0–10.5)

## 2010-05-20 LAB — POCT CARDIAC MARKERS
CKMB, poc: <1 ng/mL — ABNORMAL LOW (ref 1.0–8.0)
Troponin i, poc: <0.05 ng/mL (ref 0.00–0.09)

## 2010-05-20 LAB — D-DIMER, QUANTITATIVE: D-Dimer, Quant: 0.22 ug/mL-FEU (ref 0.00–0.48)

## 2010-05-20 LAB — DIFFERENTIAL
Eosinophils Absolute: 0.1 10*3/uL (ref 0.0–0.7)
Eosinophils Relative: 2 % (ref 0–5)
Lymphocytes Relative: 28 % (ref 12–46)
Lymphs Abs: 1.4 10*3/uL (ref 0.7–4.0)
Monocytes Absolute: 0.3 10*3/uL (ref 0.1–1.0)

## 2010-07-01 ENCOUNTER — Other Ambulatory Visit: Payer: Self-pay | Admitting: Family Medicine

## 2010-07-01 ENCOUNTER — Ambulatory Visit
Admission: RE | Admit: 2010-07-01 | Discharge: 2010-07-01 | Disposition: A | Discharge status: Home / Self Care | Payer: 59 | Source: Ambulatory Visit | Attending: Family Medicine | Admitting: Family Medicine

## 2010-07-01 DIAGNOSIS — R222 Localized swelling, mass and lump, trunk: Secondary | ICD-10-CM

## 2010-07-02 NOTE — Discharge Summary (Signed)
NAME:  Susan Berry, ASA NO.:  [id number]   MEDICAL RECORD NO.:  [id number]          PATIENT TYPE:  INP   LOCATION:  5528                         FACILITY:  [facility]   PHYSICIAN:  Acey Lav, MD  DATE OF BIRTH:  1965/05/22   DATE OF ADMISSION:  07/23/2007  DATE OF DISCHARGE:  07/26/2007                               DISCHARGE SUMMARY   DISCHARGE DIAGNOSES:  1. One episode of shortness of breath and tachycardia of unknown      etiology, questionable anxiety, resolved.  2. History of anxiety.  3. History of asthma and bronchitis.  4. Hyperlipidemia.  5. History of breast cyst removal.  6. History of tubal ligation.   LIST OF MEDICATIONS AT DISCHARGE:  1. Aspirin 81 mg p.o. daily.  2. Pravastatin 40 mg p.o. daily.  3. Klonopin 0.5 mg p.o. b.i.d.  4. Multivitamins 1 tablet daily.  5. Albuterol 1 puff q.4 h. p.r.n. wheezing and shortness of breath.   The patient will have an appointment in the outpatient clinic with Dr.  Andrey Campanile on August 09, 2007, at 3:15 p.m.  She will also have an appointment  with Dr. Anne Fu and his office will call her with an appointment in 2  weeks after discharge.  The patient was given the office telephone  number.   CONDITION AT DISCHARGE:  Asymptomatic.   Followup per Dr. Anne Fu, she will need to have stress test at the time  of her next appointment with him.   PROCEDURES:  The patient had a chest x-ray on the day of admission  showing COPD-related changes, small amount of left pleural effusion  versus focal pleural thickening.  She had a CT of the head without  contrast showing small focal lucency near juncture of the posterior  basal ganglia and posterior limb of internal capsule representing either  a small lacuna or dilated perivascular space.  This lesion was further  delineated with an MRI of the brain without contrast showing basically  that the lesion was a dilated vascular space and not a lacuna.  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  The patient also had a 2-D echo that showed an EF and the  lower limit of normal 50-55%.  No left ventricular region or wall motion  abnormalities.  Left ventricular wall thickness normal.  The bubble  study was negative.  She also had trivial/small pericardial effusion  posterior to the heart 5 mm of thickness.   CONSULTATION:  Mark C. Anne Fu, MD with Cardiology.   HISTORY OF PRESENT ILLNESS:  This is a 45 year old African American  woman with past medical history outlined above who is presenting to the  ED complaining of numbness and tingling sensation in the right arm and  the right side of the face associated with fast heart beating.  The  patient reported that all her symptoms began about 5 a.m. in the morning  of admission.  She also had headache and shortness of breath to the  point that she cold not speak; however, this resolved  soon after the  resolution of the episode.  She denied nausea and vomiting, and she  denied fever, chills, or other prior episodes.   ALLERGIES:  She is allergic to ASPIRIN in the sense that it flares up  her asthma and bronchitis.   PHYSICAL EXAMINATION:  VITAL SIGNS: Temperature 98.3, blood pressure  102/66, pulse 78, respiration rate 18, oxygen saturation 100% on room  air.  GENERAL:  She was in no acute distress, lying in bed, alert and oriented  x3.  EYES:  PERRLA.  Extraocular movements are intact.  No nystagmus.  NECK:  No thyromegaly.  No bruits.  Supple.  RESPIRATORY:  Clear to auscultation bilaterally.  CARDIOVASCULAR:  Regular rate and rhythm.  No murmurs, rubs, or gallops.  GI:  Soft, nontender, and nondistended.  Positive bowel sounds.  EXTREMITIES:  No edema.  Good pulses.  SKIN:  No rash.  No lesions.  Lymphadenopathy absent.  NEURO:  Cranial nerves II through XII intact.  Bilateral normal  strength.  No Babinski.  Normal exam to light touch.  Normal sensation  to  light touch.  Normal DTRs.  PSYCH:  Appropriate.   LABORATORY DATA:  Sodium 140, potassium 3.7, chloride 105, bicarb 25,  BUN 5, creatinine 0.9, glucose 89, bilirubin 0.8, alkaline phosphatase  52, AST 14, ALT 10, total protein 5.8, albumin 3.2, and calcium 9.6.  D-  dimer less than 0.22.  TSH 1.6, T4 0.89, and magnesium 1.8.  Coags  normal.  Urinalysis with small leukocytes, but few bacteria on micro.  Cardiac enzymes were negative x3.  Lipid profile; cholesterol 107,  triglycerides 44, HDL cholesterol 33, and LDL 65.  Hemoglobin A1c was  4.8.  Random cortisol was 3.0 obtained in the evening and then ACT  stimulation test was negative, and RPR was nonreactive.  ANA was  negative.  Hypercoagulable panel; final result is still pending.  Urine  pregnancy test was negative.  White count 4.8, hemoglobin 12.1,  platelets 196, MCV 95.7, and ANC 2.5.   ASSESSMENT AND PLAN:  1. Questionable anxiety episode with numbness and tingling sensation      on the right side of her face and arm and also with chest      pain/shortness of breath.  The patient was admitted for further      testing and the concern for cardiac etiology being high, we placed      her on telemetry, ordered 3 sets of cardiac enzymes and EKGs, and      these were all normal.  She had a 2-D echo, which was borderline      with low normal EF of 50-55%, but no other abnormalities other than      a small pleural effusion.  Cardiology saw her in the hospital in      (Dr. Anne Fu), and will consider ordering a stress test in      outpatient.  She will follow up with them in 2 weeks.  Due to the      numbness and tingling in the right side of the patient's face and      arm, TIA was considered.  CT of her head was negative and an MRI      was also negative.  We believe that this etiology is less likely      now; however, to investigate it further, we ordered a      hypercoagulable panel, which is pending, and also ANA, which was  negative.  We placed the patient on Ativan in the hospital and then      we switched to Klonopin and we will discharge the patient on this      medication.  Since there is a possible cardiac etiology of this      episode, we started her on aspirin 81 and we continued her on      pravastatin to minimize risk factors.  Her TSH and free T4 were      normal.  2. Hyperlipidemia.  As above, we continued her pravastatin as an      outpatient.  3. Anxiety.  See above.  We sent her home with Klonopin 0.5 mg p.o.      b.i.d.  She tolerated well the medication in the hospital.  4. Asthma/bronchitis.  The patient was on Primatene at home for      asthma.  We discontinued this medication and started her on      albuterol p.r.n.   VITAL SIGNS AT DISCHARGE:  Temperature 98.4; systolic blood pressure 91,  diastolic 65; pulse 78; respiratory rate 19; and oxygen saturation 100%  on room air.   LABS AT DISCHARGE:  White blood count 5.2, hemoglobin 11.9, hematocrit  35.6, and platelets 208.  Sodium 138, potassium 3.7, chloride 107,  bicarb 26, BUN 6, creatinine 0.86, glucose 100, calcium 10.3, and  magnesium 2.3.      Carlus Pavlov, M.D.  Electronically Signed      Acey Lav, MD  Electronically Signed    CG/MEDQ  D:  07/28/2007  T:  07/29/2007  Job:  [id number]   cc:   Jake Bathe, MD

## 2010-07-02 NOTE — Consult Note (Signed)
NAME:  Susan Berry, Susan Berry NO.:  [id number]   MEDICAL RECORD NO.:  [id number]          PATIENT TYPE:  INP   LOCATION:  5528                         FACILITY:  [facility]   PHYSICIAN:  Jake Bathe, MD      DATE OF BIRTH:  09-Mar-1965   DATE OF CONSULTATION:  07/25/2007  DATE OF DISCHARGE:                                 CONSULTATION   REFERRING PHYSICIAN:  Acey Lav, MD, Med Teaching Service.   REASON FOR CONSULTATION:  Susan Berry is being seen at the request of Dr.  Daiva Eves for the evaluation of nonsustained ventricular tachycardia and  palpitations.   HISTORY OF PRESENT ILLNESS:  This is a 45 year old African American  female with no personal prior cardiac history, who has an 31 year old  son who received a cardiac transplant 4 years ago at [facility] and a  grandmother and uncle with heart failure, who over the past 4 months has  been noticing increased palpitations.  Described as pounding fast heart  rate.  No syncope or presyncope.  She eventually sought attention with I  believe Dr. Clyda Greener, who recommended further evaluation by  cardiologist.  What brought her into the hospital on this occasion was  pounding and tachycardia associated with right arm and right facial  tingling and numbness concerning for TIA.   Since she has been here, telemetry was noteworthy for 8 beats of  nonsustained ventricular tachycardia at a rate of approximately 150 to  140 beats per minute on July 24, 2007, at 4 a.m.Marland Kitchen  She has also had  periodic elevations in her heart rate which are gradual and increase  from her resting heart rate in the 60s up to approximately 120 beats per  minute, then gradually decreased back to her baseline.  These on  telemetry appear to be sinus tachycardia.  There are not sudden in onset  nor do they break suddenly.   Currently, she is feeling well, relieved at her MRI result being  negative.  She denies any chest pain, any shortness of breath,  orthopnea, or PND.  No recent rashes.  No travel.  No joint pain.  No  trouble swallowing.  She has had headaches which are relatively new for  her which may be associated with this neurologic finding of right facial  and right arm numbness.   PAST MEDICAL HISTORY:  Anxiety, hyperlipidemia - on pravastatin 20 mg  once a day, asthma mostly as a child, and prior tubal ligation.   ALLERGIES:  No known drug allergies.  Previously, it had been reported  that she had an intolerance to aspirin secondary to trouble breathing,  but this was told her as a child not to take aspirin because for asthma.  She has been on normal aspirin for the past several weeks and without  any difficulty.   MEDICATIONS:  1. Pravastatin 20 mg once a day.  2. Multivitamin, Allegra p.r.n.  3. Simvastatin 20 mg once a day.  4. Protonix 40 mg once a day.  5. Aspirin 325 mg once a day.  6. Enoxaparin DVT prophylaxis dose.  7. She has received magnesium IV.   FAMILY HISTORY:  She has four children, one of which has had a heart  transplant at [facility] 4 years ago by Dr. Pat Patrick.  He is doing very  well, just graduated from [employer].  She does have a  grandmother and uncle both of which had heart failure.  Denies any early  family history of coronary artery disease or sudden death.   SOCIAL HISTORY:  Denies any tobacco use.  Occasionally, drinks alcohol  use.  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   REVIEW OF SYSTEMS:  Unless explained above, all other 12 review of  systems negative.   PHYSICAL EXAMINATION:  Blood pressure ranges from 101/69-88/59, pulse  ranges from 58-120s.  Sinus tachycardia, respirations 19, sating of 98%  on room air.  She has been positive 1 L yesterday.  GENERAL:  Alert and oriented x3 in no distress, lying in bed with her  son in the room, comfortable.  EYES:  Well-perfused conjunctivae.  EOMI.  No scleral icterus.  NECK:  No JVD.  No carotid bruits.  No thyromegaly.  Moist  mucous  membranes.  CARDIOVASCULAR:  Regular rate and rhythm with no murmurs, rubs, or  gallops.  Normal PMI.  No RV heave.  LUNGS:  Clear to auscultation bilaterally with normal respiratory  effort.  ABDOMEN:  Soft, nontender, normoactive bowel sounds.  No rebound or  guarding.  No bruits.  EXTREMITIES:  No clubbing, cyanosis, or edema.  Normal distal pulses.  NEUROLOGIC:  Nonfocal.  No tremors.  SKIN:  Warm, dry, and intact.  No rashes noted.  MUSCULOSKELETAL:  No joint effusions noted.   DATA:  ECG shows sinus rhythm, vertical axis of approximately 90  degrees.  LVH and subtle J-point elevation diffusely, QT interval was  okay.  Chest x-ray personally reviewed was interpreted as COPD with  hyperinflation and small amount of left pleural fluid and thickening.  No cardiomegaly.  An MRI of brain showed no acute findings.  Sodium 138,  potassium 3.7, BUN 6, creatinine 0.86, glucose 100, white count 5,  hemoglobin 11.9, hematocrit 35.6, platelets 208, magnesium 2.3,  cosyntropin stim test 9.1-23, negative.  RPR negative.  Homocysteine  4.9, total cholesterol 107, HDL 33, LDL 65, and hemoglobin A1c 4.8.  Cardiac enzymes negative x3.  D-dimer less than 0.22.  Liver functions  are normal.  TSH 1.6 okay.   ASSESSMENT AND PLAN:  This is a 45 year old African American female with  family history of heart failure with recent increase in  palpitations/tachycardia which is sinus, and nonsustained ventricular  tachycardia of slow rate 8 beats in duration seen on telemetry with  questionable transient ischemic attack.  1. Nonsustained ventricular tachycardia - we will check echocardiogram      to ensure structural integrity of her heart.  This is important      given her family history of heart failure including her son status      post transplant.  If her heart is structurally normal then she may      not warrant any further workup for the short run of slow VT.      Optimally, we would like to  place on low-dose beta blocker such as      metoprolol 12.5 mg twice a day.  However given her relative      hypotension with blood pressures ranging from 80s-100 systolic, I      will forego this at this time.  2. Sinus tachycardia - likely related to anxiety.  However, we will be      checking echocardiogram to ensure that there is no structural      dysfunction.  Note, there is no cardiomegaly on chest x-ray.      Continue with antianxiety medication.  As stated previously, it      would be nice to have on low-dose beta-blocker; however, with her      low blood pressure, I am hesitant to start this currently.  3. Questionable transient ischemic attack - MRI reassuring.  No      evidence of stroke.  With echocardiogram, I will order bubble study      to ensure there is no evidence of PFO.  4. Relative hypotension - IV fluids.  Continue monitor.  Stim test was      negative.  Check echo.  5. Hyperlipidemia - good response to pravastatin at home of 20 mg.      May wish to continue this due to low cost.  6. Abnormal EKG - vertical axis noted which is likely secondary to her      hyperinflated lung fields which pull the      heart down in more vertical position.  She also demonstrated LVH      under ECG.  We will be checking echocardiogram to other      confirmative diagnosis.  Note her QT interval was normal.  There is      no evidence of Brugada syndrome.  Note, VT was monomorphic.  We      will follow along with you.      Jake Bathe, MD  Electronically Signed     MCS/MEDQ  D:  07/25/2007  T:  07/25/2007  Job:  [id number]

## 2010-11-08 LAB — POCT URINALYSIS DIP (DEVICE)
Ketones, ur: NEGATIVE
Operator id: 247071
Protein, ur: NEGATIVE
Specific Gravity, Urine: 1.015
Urobilinogen, UA: 1
pH: 5.5

## 2010-11-08 LAB — INFLUENZA A AND B ANTIGEN (CONVERTED LAB)
Inflenza A Ag: NEGATIVE
Influenza B Ag: NEGATIVE

## 2010-11-14 LAB — COMPREHENSIVE METABOLIC PANEL
Albumin: 3.2 — ABNORMAL LOW
BUN: 5 — ABNORMAL LOW
Calcium: 9.6
Creatinine, Ser: 0.88
Total Protein: 5.8 — ABNORMAL LOW

## 2010-11-14 LAB — ACTH STIMULATION, 3 TIME POINTS: Cortisol, 30 Min: 23.3 (ref >20)

## 2010-11-14 LAB — CBC
HCT: 33.9 — ABNORMAL LOW
HCT: 35.6 — ABNORMAL LOW
MCHC: 33.4
MCHC: 33.5
MCHC: 34.3
MCV: 95.8
MCV: 96
Platelets: 204
Platelets: 208
Platelets: 218
RBC: 3.68 — ABNORMAL LOW
RDW: 12.1
RDW: 12.2
RDW: 12.2
WBC: 4.8

## 2010-11-14 LAB — CARDIAC PANEL(CRET KIN+CKTOT+MB+TROPI)
Troponin I: 0.01
Troponin I: <0.01

## 2010-11-14 LAB — URINALYSIS, ROUTINE W REFLEX MICROSCOPIC
Bilirubin Urine: NEGATIVE
Glucose, UA: NEGATIVE
Hgb urine dipstick: NEGATIVE
Ketones, ur: NEGATIVE
Protein, ur: NEGATIVE
pH: 6

## 2010-11-14 LAB — CARDIOLIPIN ANTIBODIES, IGG, IGM, IGA
Anticardiolipin IgA: 14 (ref <13)
Anticardiolipin IgM: 22 (ref <10)

## 2010-11-14 LAB — LUPUS ANTICOAGULANT PANEL
DRVVT: 36.8 (ref 36.1–47.0)
PTT Lupus Anticoagulant: 41.8 (ref 36.3–48.8)

## 2010-11-14 LAB — URINE MICROSCOPIC-ADD ON

## 2010-11-14 LAB — LIPID PANEL
Cholesterol: 107
HDL: 33 — ABNORMAL LOW
LDL Cholesterol: 65
Total CHOL/HDL Ratio: 3.2
Triglycerides: 44

## 2010-11-14 LAB — PROTEIN C ACTIVITY: Protein C Activity: 94 % (ref 75–133)

## 2010-11-14 LAB — BASIC METABOLIC PANEL
BUN: 6
BUN: 6
CO2: 26
CO2: 28
Calcium: 10.1
Chloride: 107
GFR calc non Af Amer: >60
Glucose, Bld: 100 — ABNORMAL HIGH
Glucose, Bld: 93
Potassium: 3.7

## 2010-11-14 LAB — HOMOCYSTEINE: Homocysteine: 4.9

## 2010-11-14 LAB — MAGNESIUM: Magnesium: 1.8

## 2010-11-14 LAB — BETA-2-GLYCOPROTEIN I ABS, IGG/M/A: Beta-2 Glyco I IgG: 5 U/mL (ref <20)

## 2010-11-14 LAB — FACTOR 5 LEIDEN

## 2010-11-14 LAB — ANTITHROMBIN III: AntiThromb III Func: 89 (ref 76–126)

## 2010-11-14 LAB — DIFFERENTIAL
Basophils Relative: 1
Lymphocytes Relative: 35
Monocytes Relative: 8
Neutro Abs: 2.5
Neutrophils Relative %: 52

## 2010-11-14 LAB — POCT I-STAT, CHEM 8
BUN: 5 — ABNORMAL LOW
Calcium, Ion: 1.41 — ABNORMAL HIGH
Creatinine, Ser: 0.9
Hemoglobin: 12.9
TCO2: 25

## 2010-11-14 LAB — POCT CARDIAC MARKERS
Myoglobin, poc: 43.8
Operator id: 272551
Operator id: 288831
Troponin i, poc: <0.05

## 2010-11-14 LAB — POCT PREGNANCY, URINE
Operator id: 272551
Preg Test, Ur: NEGATIVE

## 2010-11-14 LAB — PROTIME-INR
INR: 1
Prothrombin Time: 13.1

## 2010-11-14 LAB — TSH: TSH: 1.618

## 2010-11-14 LAB — PROTEIN C, TOTAL: Protein C, Total: 62 % — ABNORMAL LOW (ref 70–140)

## 2010-11-14 LAB — PROTHROMBIN GENE MUTATION

## 2010-12-06 ENCOUNTER — Other Ambulatory Visit: Payer: Self-pay | Admitting: Otolaryngology

## 2010-12-06 DIAGNOSIS — E041 Nontoxic single thyroid nodule: Secondary | ICD-10-CM

## 2010-12-13 ENCOUNTER — Ambulatory Visit
Admission: RE | Admit: 2010-12-13 | Discharge: 2010-12-13 | Disposition: A | Discharge status: Home / Self Care | Payer: 59 | Source: Ambulatory Visit | Attending: Otolaryngology | Admitting: Otolaryngology

## 2010-12-13 DIAGNOSIS — E041 Nontoxic single thyroid nodule: Secondary | ICD-10-CM

## 2011-10-05 ENCOUNTER — Encounter (HOSPITAL_COMMUNITY): Payer: Self-pay | Admitting: Emergency Medicine

## 2011-10-05 ENCOUNTER — Emergency Department (HOSPITAL_COMMUNITY): Payer: 59

## 2011-10-05 ENCOUNTER — Emergency Department (HOSPITAL_COMMUNITY)
Admission: EM | Admit: 2011-10-05 | Discharge: 2011-10-05 | Disposition: A | Discharge status: Home / Self Care | Payer: 59 | Attending: Emergency Medicine | Admitting: Emergency Medicine

## 2011-10-05 DIAGNOSIS — Y998 Other external cause status: Secondary | ICD-10-CM | POA: Insufficient documentation

## 2011-10-05 DIAGNOSIS — X500XXA Overexertion from strenuous movement or load, initial encounter: Secondary | ICD-10-CM | POA: Insufficient documentation

## 2011-10-05 DIAGNOSIS — Y9301 Activity, walking, marching and hiking: Secondary | ICD-10-CM | POA: Insufficient documentation

## 2011-10-05 DIAGNOSIS — S93409A Sprain of unspecified ligament of unspecified ankle, initial encounter: Secondary | ICD-10-CM

## 2011-10-05 MED ORDER — HYDROCODONE-ACETAMINOPHEN 5-500 MG PO TABS: 1.0000 | ORAL_TABLET | Freq: Four times a day (QID) | ORAL | Status: AC | PRN

## 2011-10-05 MED ORDER — OXYCODONE-ACETAMINOPHEN 5-325 MG PO TABS
2.0000 | ORAL_TABLET | Freq: Once | ORAL | Status: AC
  Administered 2011-10-05: 2 via ORAL
  Filled 2011-10-05: qty 2

## 2011-10-05 NOTE — ED Notes (Signed)
The patient is AOx4 and comfortable with her discharge instructions. 

## 2011-10-05 NOTE — Progress Notes (Signed)
Orthopedic Tech Progress Note Patient Details:  Susan Berry 1965/03/06 [id number]  Ortho Devices Type of Ortho Device: Crutches;ASO Ortho Device/Splint Location: right ankle Ortho Device/Splint Interventions: Application   Aleyssa Pike 10/05/2011, 10:27 PM

## 2011-10-05 NOTE — ED Notes (Signed)
Patient tripped and fell at church this evening.  Patient states she did hit her head but no LOC.  Patient states she does not have any head pain.  Patient states that she has pain in right ankle.  There is some swelling noted to ankle.

## 2011-10-05 NOTE — ED Provider Notes (Signed)
History     CSN: [id number]  Arrival date & time 10/05/11  2003   First MD Initiated Contact with Patient 10/05/11 2057      Chief Complaint  Patient presents with  . Ankle Pain    (Consider location/radiation/quality/duration/timing/severity/associated sxs/prior treatment) HPI Comments: Patient states she was walking to church and he, also twisted her right ankle, which is now painful.  She has not taken any over-the-counter medication, applied ice or heat.  She denies any previous injury to that ankle  Patient is a 46 y.o. female presenting with ankle pain. The history is provided by the patient.  Ankle Pain  The incident occurred 1 to 2 hours ago. The injury mechanism was a fall.    History reviewed. No pertinent past medical history.  Past Surgical History  Procedure Date  . Breast cyst excision     History reviewed. No pertinent family history.  History  Substance Use Topics  . Smoking status: Never Smoker   . Smokeless tobacco: Not on file  . Alcohol Use: No    OB History    Grav Para Term Preterm Abortions TAB SAB Ect Mult Living                  Review of Systems  Constitutional: Negative for fever and chills.  Gastrointestinal: Negative for nausea.  Musculoskeletal: Positive for joint swelling.  Skin: Negative for wound.  Neurological: Negative for dizziness.    Allergies  Aspirin  Home Medications   Current Outpatient Rx  Name Route Sig Dispense Refill  . ALBUTEROL SULFATE HFA 108 (90 BASE) MCG/ACT IN AERS Inhalation Inhale 2 puffs into the lungs every 6 (six) hours as needed. For shortness of breath or difficulty breathing      BP 121/73  Pulse 70  Temp 98.7 F (37.1 C) (Oral)  Resp 20  SpO2 100%  Physical Exam  Constitutional: She appears well-developed.  HENT:  Head: Normocephalic.  Eyes: Pupils are equal, round, and reactive to light.  Cardiovascular: Normal rate.   Pulmonary/Chest: Effort normal.  Musculoskeletal: She exhibits  tenderness. She exhibits no edema.       Feet:  Neurological: She is alert.  Skin: Skin is warm.    ED Course  Procedures (including critical care time)  Labs Reviewed - No data to display Dg Ankle Complete Right  10/05/2011  *RADIOLOGY REPORT*  Clinical Data: Fall  RIGHT ANKLE - COMPLETE 3+ VIEW  Comparison: None.  Findings: Normal alignment and no fracture.  Joint space is normal.  IMPRESSION: Negative  Original Report Authenticated By: Camelia Phenes, M.D.     No diagnosis found.    MDM   Review of x-ray reveals no, fracture.  We'll place patient in ASO, crutches, and pain medication, with followup by orthopedics        Arman Filter, NP 10/05/11 2147

## 2011-10-11 NOTE — ED Provider Notes (Signed)
Medical screening examination/treatment/procedure(s) were performed by non-physician practitioner and as supervising physician I was immediately available for consultation/collaboration.   Susan Berry. Oletta Lamas, MD 10/11/11 4098

## 2012-07-22 ENCOUNTER — Encounter (HOSPITAL_COMMUNITY): Payer: Self-pay | Admitting: *Deleted

## 2012-07-22 ENCOUNTER — Emergency Department (HOSPITAL_COMMUNITY)
Admission: EM | Admit: 2012-07-22 | Discharge: 2012-07-22 | Disposition: A | Discharge status: Home / Self Care | Payer: 59 | Attending: Emergency Medicine | Admitting: Emergency Medicine

## 2012-07-22 DIAGNOSIS — H5710 Ocular pain, unspecified eye: Secondary | ICD-10-CM | POA: Insufficient documentation

## 2012-07-22 DIAGNOSIS — H109 Unspecified conjunctivitis: Secondary | ICD-10-CM | POA: Insufficient documentation

## 2012-07-22 DIAGNOSIS — H5789 Other specified disorders of eye and adnexa: Secondary | ICD-10-CM | POA: Insufficient documentation

## 2012-07-22 DIAGNOSIS — H5712 Ocular pain, left eye: Secondary | ICD-10-CM

## 2012-07-22 DIAGNOSIS — H53149 Visual discomfort, unspecified: Secondary | ICD-10-CM | POA: Insufficient documentation

## 2012-07-22 DIAGNOSIS — H5440 Blindness, one eye, unspecified eye: Secondary | ICD-10-CM | POA: Insufficient documentation

## 2012-07-22 MED ORDER — TOBRAMYCIN 0.3 % OP SOLN
2.0000 [drp] | OPHTHALMIC | Status: DC
  Administered 2012-07-22: 2 [drp] via OPHTHALMIC
  Filled 2012-07-22: qty 5

## 2012-07-22 MED ORDER — TETRACAINE HCL 0.5 % OP SOLN
2.0000 [drp] | Freq: Once | OPHTHALMIC | Status: AC
  Administered 2012-07-22: 2 [drp] via OPHTHALMIC
  Filled 2012-07-22 (×2): qty 2

## 2012-07-22 MED ORDER — HYDROCODONE-ACETAMINOPHEN 5-325 MG PO TABS: ORAL_TABLET | ORAL | Status: DC

## 2012-07-22 MED ORDER — FLUORESCEIN SODIUM 1 MG OP STRP
1.0000 | ORAL_STRIP | Freq: Once | OPHTHALMIC | Status: AC
  Administered 2012-07-22: 1 via OPHTHALMIC
  Filled 2012-07-22 (×2): qty 1
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
## 2012-07-22 NOTE — ED Provider Notes (Signed)
History    This chart was scribed for Rhea Bleacher, PA working with Shelda Jakes, MD by ED Scribe, Burman Nieves. This patient was seen in room TR04C/TR04C and the patient's care was started at 8:20 PM.   CSN: [id number]  Arrival date & time 07/22/12  2020   None     Chief Complaint  Patient presents with  . Eye Pain  . Conjunctivitis    (Consider location/radiation/quality/duration/timing/severity/associated sxs/prior treatment) The history is provided by the patient. No language interpreter was used.   HPI Comments: Susan Berry is a 47 y.o. female who presents to the Emergency Department complaining of moderate constant irritation to the left eye that has been itching and burning for the past couple of days. Sx's started off with cloudy vision while she was at work. Pt complains that her sx's have gotten progressively worse today. She states she has been using otc saline eye drops with no immediate relief. Pt is sensitive to bright lights.Pt denies fever, chills, cough, nausea, vomiting, diarrhea, SOB, weakness, and any other associated symptoms. Pt had eye surgery in the past on her left eye.   History reviewed. No pertinent past medical history.  Past Surgical History  Procedure Laterality Date  . Breast cyst excision      History reviewed. No pertinent family history.  History  Substance Use Topics  . Smoking status: Never Smoker   . Smokeless tobacco: Not on file  . Alcohol Use: No    OB History   Grav Para Term Preterm Abortions TAB SAB Ect Mult Living                  Review of Systems  Constitutional: Negative for fever and chills.  HENT: Negative for neck pain.   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  Respiratory: Negative for cough and shortness of breath.   Gastrointestinal: Negative for nausea, vomiting and diarrhea.  Neurological: Negative for  headaches.    Allergies  Aspirin  Home Medications   Current Outpatient Rx  Name  Route  Sig  Dispense  Refill  . albuterol (PROVENTIL HFA;VENTOLIN HFA) 108 (90 BASE) MCG/ACT inhaler   Inhalation   Inhale 2 puffs into the lungs every 6 (six) hours as needed. For shortness of breath or difficulty breathing           BP 128/79  Pulse 79  Temp(Src) 98.4 F (36.9 C) (Oral)  Resp 20  Ht 5\' 11"  (1.803 m)  Wt 168 lb (76.204 kg)  BMI 23.44 kg/m2  SpO2 98%  Physical Exam  Nursing note and vitals reviewed. Constitutional: She appears well-developed and well-nourished. No distress.  HENT:  Head: Normocephalic and atraumatic.  Eyes: EOM are normal. Right eye exhibits no chemosis, no discharge and no exudate. No foreign body present in the right eye. Left eye exhibits discharge (tearing). Left eye exhibits no chemosis and no exudate. No foreign body present in the left eye. Right conjunctiva is not injected. Right conjunctiva has no hemorrhage. Left conjunctiva is injected. Left conjunctiva has no hemorrhage. Right eye exhibits normal extraocular motion. Left eye exhibits normal extraocular motion. Right pupil is round and reactive. Left pupil is not round (previous surgery). Left pupil is reactive.  Slit lamp exam:      The left eye shows no corneal abrasion and no fluorescein uptake.  Irregular left pupil  Neck: Normal range of motion. Neck supple. No  tracheal deviation present.  Cardiovascular: Normal rate, regular rhythm and normal heart sounds.   Pulmonary/Chest: Effort normal and breath sounds normal. No respiratory distress.  Abdominal: Soft. There is no tenderness.  Musculoskeletal: Normal range of motion.  Neurological: She is alert.  Skin: Skin is warm and dry.  Psychiatric: She has a normal mood and affect. Her behavior is normal.    ED Course  Procedures (including critical care time) DIAGNOSTIC STUDIES: Oxygen Saturation is 98% on room air, normal by my  interpretation.    COORDINATION OF CARE:  10:11 PM Discussed ED treatment with pt and pt agrees.    Labs Reviewed - No data to display No results found.   1. Eye pain, left    Patient seen and examined. Work-up initiated.    Vital signs reviewed and are as follows: Filed Vitals:   07/22/12 2024  BP: 128/79  Pulse: 79  Temp: 98.4 F (36.9 C)  Resp: 20   Two drops of tetracaine/proparacaine instilled into affected eye.   Fluorescein strip applied to affected eye. Wood's lamp used to assess for corneal abrasion. No corneal abrasion identified. No foreign bodies noted. No visible hyphema.   Tonometry performed. Left eye pressure: 15  Patient tolerated procedure well without immediate complication.   Tetracaine made pain better.   Urged ophtho f/u in next 24 hrs. Referral given.   Abx for possible conjunctivitis.      MDM  Eye pain, conjunctivitis. Note pupil irregular and vision decreased due to previous surgery. No foreign bodies noted on fluorescein stain. No pain with light shined into contralateral eye -- doubt iritis. No surrounding erythema, swelling, vision changes/loss suspicious for orbital or periorbital cellulitis. No signs of glaucoma, intraocular pressure normal. No symptoms of retinal detachment. No ophthalmologic emergency suspected. Outpatient referral urged as patient seems to have more pain that I would suspect for just conjunctivitis.       I personally performed the services described in this documentation, which was scribed in my presence. The recorded information has been reviewed and is accurate.    Renne Crigler, PA-C 07/24/12 1644

## 2012-07-29 NOTE — ED Provider Notes (Signed)
Medical screening examination/treatment/procedure(s) were performed by non-physician practitioner and as supervising physician I was immediately available for consultation/collaboration.   Denim Start W. Chellie Vanlue, MD 07/29/12 1213 

## 2012-09-30 ENCOUNTER — Other Ambulatory Visit: Payer: Self-pay | Admitting: Obstetrics

## 2012-11-02 ENCOUNTER — Encounter (HOSPITAL_COMMUNITY): Payer: Self-pay | Admitting: *Deleted

## 2012-11-12 ENCOUNTER — Encounter (HOSPITAL_COMMUNITY): Payer: Self-pay | Admitting: Pharmacist

## 2012-11-22 NOTE — H&P (Signed)
NAME:  JHANAE, JASKOWIAK NO.:  [id number]  MEDICAL RECORD NO.:  [id number]  LOCATION:  PERIO                         FACILITY:  [facility]  PHYSICIAN:  Kathreen Cosier, M.D.DATE OF BIRTH:  April 12, 1965  DATE OF ADMISSION:  09/30/2012 DATE OF DISCHARGE:                             HISTORY & PHYSICAL   DATE OF SURGERY:  November 24, 2012.  The patient is a 47 year old, gravida 4, para 4-0-0-4.  She states she had a procedure 5 years ago because of bleeding with sex and now complains of bleeding with sex again and she is for hysteroscopy D and C.  PAST SURGICAL HISTORY:  She had a breast cyst, and she also had a gynecologic procedure which she does not know about.  PAST SYSTEM REVIEW:  She is allergic to aspirin.  SOCIAL HISTORY:  Negative.  PHYSICAL EXAMINATION:  GENERAL:  A well-developed female in no distress. HEENT:  Negative. LUNGS:  Clear to P and A. HEART:  Regular rhythm.  No murmurs.  No gallops. BREASTS:  Negative. ABDOMEN:  Negative.  Uterus normal size.  Negative adnexa.  Cervical externa genitalia normal. EXTREMITIES:  Negative.          ______________________________ Kathreen Cosier, M.D.     BAM/MEDQ  D:  11/22/2012  T:  11/22/2012  Job:  [id number]

## 2012-11-24 ENCOUNTER — Encounter (HOSPITAL_COMMUNITY): Payer: Self-pay | Admitting: *Deleted

## 2012-11-24 ENCOUNTER — Ambulatory Visit (HOSPITAL_COMMUNITY): Payer: 59 | Admitting: Anesthesiology

## 2012-11-24 ENCOUNTER — Ambulatory Visit (HOSPITAL_COMMUNITY)
Admission: RE | Admit: 2012-11-24 | Discharge: 2012-11-24 | Disposition: A | Discharge status: Home / Self Care | Payer: 59 | Source: Ambulatory Visit | Attending: Obstetrics | Admitting: Obstetrics

## 2012-11-24 ENCOUNTER — Encounter (HOSPITAL_COMMUNITY)
Admission: RE | Disposition: A | Discharge status: Home / Self Care | Payer: Self-pay | Source: Ambulatory Visit | Attending: Obstetrics

## 2012-11-24 ENCOUNTER — Encounter (HOSPITAL_COMMUNITY): Payer: 59 | Admitting: Anesthesiology

## 2012-11-24 DIAGNOSIS — N949 Unspecified condition associated with female genital organs and menstrual cycle: Secondary | ICD-10-CM | POA: Insufficient documentation

## 2012-11-24 DIAGNOSIS — N938 Other specified abnormal uterine and vaginal bleeding: Secondary | ICD-10-CM | POA: Insufficient documentation

## 2012-11-24 HISTORY — DX: Nontoxic goiter, unspecified: E04.9

## 2012-11-24 HISTORY — PX: HYSTEROSCOPY W/D&C: SHX1775

## 2012-11-24 HISTORY — DX: Unspecified asthma, uncomplicated: J45.909

## 2012-11-24 LAB — CBC
HCT: 36.1 % (ref 36.0–46.0)
MCHC: 33 g/dL (ref 30.0–36.0)
MCV: 91.6 fL (ref 78.0–100.0)
Platelets: 224 10*3/uL (ref 150–400)
RDW: 11.6 % (ref 11.5–15.5)

## 2012-11-24 LAB — PREGNANCY, URINE: Preg Test, Ur: NEGATIVE

## 2012-11-24 SURGERY — DILATATION AND CURETTAGE /HYSTEROSCOPY
Anesthesia: General | Site: Vagina | Wound class: Clean Contaminated

## 2012-11-24 MED ORDER — PROPOFOL 10 MG/ML IV EMUL
INTRAVENOUS | Status: AC
  Filled 2012-11-24: qty 20

## 2012-11-24 MED ORDER — ACETAMINOPHEN-CODEINE #3 300-30 MG PO TABS
2.0000 | ORAL_TABLET | ORAL | Status: AC | PRN
  Administered 2012-11-24: 1 via ORAL

## 2012-11-24 MED ORDER — ONDANSETRON HCL 4 MG/2ML IJ SOLN
INTRAMUSCULAR | Status: AC
  Filled 2012-11-24: qty 2

## 2012-11-24 MED ORDER — MIDAZOLAM HCL 2 MG/2ML IJ SOLN
INTRAMUSCULAR | Status: AC
  Filled 2012-11-24: qty 2

## 2012-11-24 MED ORDER — FENTANYL CITRATE 0.05 MG/ML IJ SOLN
25.0000 ug | INTRAMUSCULAR | Status: DC | PRN
  Administered 2012-11-24: 50 ug via INTRAVENOUS
  Administered 2012-11-24: 25 ug via INTRAVENOUS

## 2012-11-24 MED ORDER — FENTANYL CITRATE 0.05 MG/ML IJ SOLN
INTRAMUSCULAR | Status: AC
  Administered 2012-11-24: 25 ug via INTRAVENOUS
  Filled 2012-11-24: qty 2

## 2012-11-24 MED ORDER — FENTANYL CITRATE 0.05 MG/ML IJ SOLN
INTRAMUSCULAR | Status: AC
  Filled 2012-11-24: qty 2

## 2012-11-24 MED ORDER — DEXAMETHASONE SODIUM PHOSPHATE 10 MG/ML IJ SOLN
INTRAMUSCULAR | Status: DC | PRN
  Administered 2012-11-24: 10 mg via INTRAVENOUS

## 2012-11-24 MED ORDER — GLYCINE 1.5 % IR SOLN
Status: DC | PRN
  Administered 2012-11-24: 3000 mL

## 2012-11-24 MED ORDER — LIDOCAINE HCL (CARDIAC) 20 MG/ML IV SOLN
INTRAVENOUS | Status: DC | PRN
  Administered 2012-11-24: 30 mg via INTRAVENOUS

## 2012-11-24 MED ORDER — MEPERIDINE HCL 25 MG/ML IJ SOLN: 6.2500 mg | INTRAMUSCULAR | Status: DC | PRN

## 2012-11-24 MED ORDER — ONDANSETRON HCL 4 MG/2ML IJ SOLN
INTRAMUSCULAR | Status: DC | PRN
  Administered 2012-11-24: 4 mg via INTRAMUSCULAR

## 2012-11-24 MED ORDER — MIDAZOLAM HCL 5 MG/5ML IJ SOLN
INTRAMUSCULAR | Status: DC | PRN
  Administered 2012-11-24: 2 mg via INTRAVENOUS

## 2012-11-24 MED ORDER — LIDOCAINE HCL (CARDIAC) 20 MG/ML IV SOLN
INTRAVENOUS | Status: AC
  Filled 2012-11-24: qty 5

## 2012-11-24 MED ORDER — PROMETHAZINE HCL 25 MG/ML IJ SOLN: 6.2500 mg | INTRAMUSCULAR | Status: DC | PRN

## 2012-11-24 MED ORDER — FENTANYL CITRATE 0.05 MG/ML IJ SOLN
INTRAMUSCULAR | Status: DC | PRN
  Administered 2012-11-24: 100 ug via INTRAVENOUS

## 2012-11-24 MED ORDER — LIDOCAINE HCL 1 % IJ SOLN
INTRAMUSCULAR | Status: AC
  Filled 2012-11-24: qty 20

## 2012-11-24 MED ORDER — ACETAMINOPHEN-CODEINE #3 300-30 MG PO TABS
ORAL_TABLET | ORAL | Status: DC
Start: 2012-11-24 — End: 2012-11-24
  Filled 2012-11-24: qty 1

## 2012-11-24 MED ORDER — LIDOCAINE HCL 1 % IJ SOLN
INTRAMUSCULAR | Status: DC | PRN
  Administered 2012-11-24: 10 mL

## 2012-11-24 MED ORDER — PROPOFOL 10 MG/ML IV BOLUS
INTRAVENOUS | Status: DC | PRN
  Administered 2012-11-24: 200 mg via INTRAVENOUS

## 2012-11-24 MED ORDER — MIDAZOLAM HCL 2 MG/2ML IJ SOLN: 0.5000 mg | Freq: Once | INTRAMUSCULAR | Status: DC | PRN

## 2012-11-24 MED ORDER — LACTATED RINGERS IV SOLN
INTRAVENOUS | Status: DC
  Administered 2012-11-24 (×2): via INTRAVENOUS

## 2012-11-24 MED ORDER — DEXAMETHASONE SODIUM PHOSPHATE 10 MG/ML IJ SOLN
INTRAMUSCULAR | Status: AC
  Filled 2012-11-24: qty 1

## 2012-11-24 SURGICAL SUPPLY — 15 items
CANISTER SUCTION 2500CC (MISCELLANEOUS) ×2 IMPLANT
CATH ROBINSON RED A/P 16FR (CATHETERS) ×2 IMPLANT
CLOTH BEACON ORANGE TIMEOUT ST (SAFETY) ×2 IMPLANT
CONTAINER PREFILL 10% NBF 60ML (FORM) ×4 IMPLANT
DRESSING TELFA 8X3 (GAUZE/BANDAGES/DRESSINGS) ×2 IMPLANT
ELECT REM PT RETURN 9FT ADLT (ELECTROSURGICAL) ×2
ELECTRODE REM PT RTRN 9FT ADLT (ELECTROSURGICAL) ×1 IMPLANT
GLOVE BIO SURGEON STRL SZ8.5 (GLOVE) ×2 IMPLANT
GOWN PREVENTION PLUS XXLARGE (GOWN DISPOSABLE) ×2 IMPLANT
GOWN STRL REIN XL XLG (GOWN DISPOSABLE) ×4 IMPLANT
LOOP ANGLED CUTTING 22FR (CUTTING LOOP) IMPLANT
PACK HYSTEROSCOPY LF (CUSTOM PROCEDURE TRAY) ×2 IMPLANT
PAD OB MATERNITY 4.3X12.25 (PERSONAL CARE ITEMS) ×2 IMPLANT
TOWEL OR 17X24 6PK STRL BLUE (TOWEL DISPOSABLE) ×4 IMPLANT
WATER STERILE IRR 1000ML POUR (IV SOLUTION) ×2 IMPLANT

## 2012-11-24 NOTE — Transfer of Care (Signed)
Immediate Anesthesia Transfer of Care Note  Patient: Susan Berry  Procedure(s) Performed: Procedure(s): DILATATION AND CURETTAGE /HYSTEROSCOPY (N/A)  Patient Location: PACU  Anesthesia Type:General  Level of Consciousness: awake, alert  and oriented  Airway & Oxygen Therapy: Patient Spontanous Breathing and Patient connected to nasal cannula oxygen  Post-op Assessment: Report given to PACU RN and Post -op Vital signs reviewed and stable  Post vital signs: stable  Complications: No apparent anesthesia complications

## 2012-11-24 NOTE — H&P (Signed)
  There has been no change in her history and physical since the initial dictation and

## 2012-11-24 NOTE — Anesthesia Preprocedure Evaluation (Signed)
Anesthesia Evaluation  Patient identified by MRN, date of birth, ID band Patient awake    Reviewed: Allergy & Precautions, H&P , Patient's Chart, lab work & pertinent test results, reviewed documented beta blocker date and time   History of Anesthesia Complications Negative for: history of anesthetic complications  Airway Mallampati: II TM Distance: >3 FB Neck ROM: full    Dental no notable dental hx.    Pulmonary neg pulmonary ROS, asthma ,  breath sounds clear to auscultation  Pulmonary exam normal       Cardiovascular Exercise Tolerance: Good negative cardio ROS  Rhythm:regular Rate:Normal     Neuro/Psych negative neurological ROS  negative psych ROS   GI/Hepatic negative GI ROS, Neg liver ROS,   Endo/Other  negative endocrine ROS  Renal/GU negative Renal ROS     Musculoskeletal   Abdominal   Peds  Hematology negative hematology ROS (+)   Anesthesia Other Findings  Enlarged thyroid gland   left side - f/u with md yearly   Reproductive/Obstetrics negative OB ROS                           Anesthesia Physical Anesthesia Plan  ASA: II  Anesthesia Plan: General LMA   Post-op Pain Management:    Induction:   Airway Management Planned:   Additional Equipment:   Intra-op Plan:   Post-operative Plan:   Informed Consent: I have reviewed the patients History and Physical, chart, labs and discussed the procedure including the risks, benefits and alternatives for the proposed anesthesia with the patient or authorized representative who has indicated his/her understanding and acceptance.   Dental Advisory Given  Plan Discussed with: CRNA, Surgeon and Anesthesiologist  Anesthesia Plan Comments:         Anesthesia Quick Evaluation

## 2012-11-24 NOTE — Op Note (Signed)
preop diagnosis dysfunctional uterine bleeding Postop diagnosis same Received hysteroscopy D&C Anesthesia Gen. Procedure on the general anesthesia patient in lithotomy position perineum and vagina prepped and draped bladder emptied with a Foley catheter the uterus is normal size negative adnexa speculum placed in the vagina and the cervix grasped grasped anteriorly 10 cc of 1% Xylocaine inserted injected into the cervix the endometrial cavity sounded 11 cm and the cervix dilated to #25 Shawnie Pons the diagnostic hysteroscopy inserted and the cavity was noted to been normal hysteroscope was removed endocervical curettage done and then endometrial curettage small amount of tissue obtained fluid deficit  130  cc patient tolerated the procedure well taken to recovery in good condition end of dictation dictated by Dr. Gaynell Face

## 2012-11-25 ENCOUNTER — Encounter (HOSPITAL_COMMUNITY): Payer: Self-pay | Admitting: Obstetrics

## 2012-11-25 NOTE — Anesthesia Postprocedure Evaluation (Signed)
  Anesthesia Post-op Note  Patient: Susan Berry  Procedure(s) Performed: Procedure(s): DILATATION AND CURETTAGE /HYSTEROSCOPY (N/A) Patient is awake and responsive. Pain and nausea are reasonably well controlled. Vital signs are stable and clinically acceptable. Oxygen saturation is clinically acceptable. There are no apparent anesthetic complications at this time. Patient is ready for discharge.

## 2013-06-12 ENCOUNTER — Encounter (HOSPITAL_COMMUNITY): Payer: Self-pay | Admitting: Emergency Medicine

## 2013-06-12 ENCOUNTER — Emergency Department (HOSPITAL_COMMUNITY): Payer: 59

## 2013-06-12 ENCOUNTER — Emergency Department (HOSPITAL_COMMUNITY)
Admission: EM | Admit: 2013-06-12 | Discharge: 2013-06-12 | Disposition: A | Discharge status: Home / Self Care | Payer: 59 | Attending: Emergency Medicine | Admitting: Emergency Medicine

## 2013-06-12 DIAGNOSIS — J45909 Unspecified asthma, uncomplicated: Secondary | ICD-10-CM | POA: Insufficient documentation

## 2013-06-12 DIAGNOSIS — R0789 Other chest pain: Secondary | ICD-10-CM

## 2013-06-12 DIAGNOSIS — E049 Nontoxic goiter, unspecified: Secondary | ICD-10-CM | POA: Insufficient documentation

## 2013-06-12 DIAGNOSIS — Z888 Allergy status to other drugs, medicaments and biological substances status: Secondary | ICD-10-CM | POA: Insufficient documentation

## 2013-06-12 DIAGNOSIS — R109 Unspecified abdominal pain: Secondary | ICD-10-CM

## 2013-06-12 MED ORDER — HYDROCODONE-ACETAMINOPHEN 5-325 MG PO TABS: 1.0000 | ORAL_TABLET | Freq: Four times a day (QID) | ORAL | Status: DC | PRN

## 2013-06-12 MED ORDER — IBUPROFEN 800 MG PO TABS: 800.0000 mg | ORAL_TABLET | Freq: Three times a day (TID) | ORAL | Status: DC | PRN
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
## 2013-06-12 NOTE — Discharge Instructions (Signed)
Return here as needed for any worsening in your condition.  Your x-rays did not show any abnormality. followup with a primary care doctor or urgent care as needed
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
## 2013-06-12 NOTE — ED Provider Notes (Signed)
Medical screening examination/treatment/procedure(s) were performed by non-physician practitioner and as supervising physician I was immediately available for consultation/collaboration.   EKG Interpretation None      Rolland Porter, MD, Abram Sander   Janice Norrie, MD 06/12/13 2258

## 2013-09-26 ENCOUNTER — Encounter (HOSPITAL_COMMUNITY): Payer: Self-pay | Admitting: Emergency Medicine

## 2013-09-26 ENCOUNTER — Emergency Department (INDEPENDENT_AMBULATORY_CARE_PROVIDER_SITE_OTHER)
Admission: EM | Admit: 2013-09-26 | Discharge: 2013-09-26 | Disposition: A | Discharge status: Home / Self Care | Payer: 59 | Source: Home / Self Care | Attending: Family Medicine | Admitting: Family Medicine

## 2013-09-26 DIAGNOSIS — K047 Periapical abscess without sinus: Secondary | ICD-10-CM

## 2013-09-26 DIAGNOSIS — K029 Dental caries, unspecified: Secondary | ICD-10-CM

## 2013-09-26 DIAGNOSIS — K044 Acute apical periodontitis of pulpal origin: Secondary | ICD-10-CM

## 2013-09-26 MED ORDER — FLUCONAZOLE 150 MG PO TABS: 150.0000 mg | ORAL_TABLET | Freq: Every day | ORAL | Status: DC

## 2013-09-26 MED ORDER — CHLORHEXIDINE GLUCONATE 0.12 % MT SOLN: 15.0000 mL | Freq: Two times a day (BID) | OROMUCOSAL | Status: DC

## 2013-09-26 MED ORDER — CLINDAMYCIN HCL 300 MG PO CAPS: 300.0000 mg | ORAL_CAPSULE | Freq: Three times a day (TID) | ORAL | Status: DC

## 2013-09-26 MED ORDER — IBUPROFEN 800 MG PO TABS
ORAL_TABLET | ORAL | Status: AC
  Filled 2013-09-26: qty 1

## 2013-09-26 MED ORDER — IBUPROFEN 800 MG PO TABS
800.0000 mg | ORAL_TABLET | Freq: Once | ORAL | Status: AC
  Administered 2013-09-26: 800 mg via ORAL

## 2013-09-26 NOTE — Discharge Instructions (Signed)
You have a dental infection that needs antibiotics to clear Please take these and consider using yogurt, cheese, or a probiotic to balance out your gut bacteria Please use the mouthwash in the future as needed for dental hygiene and prevention of infection Please follow up with your dentist.    Dental Abscess A dental abscess is a collection of infected fluid (pus) from a bacterial infection in the inner part of the tooth (pulp). It usually occurs at the end of the tooth's root.  CAUSES   Severe tooth decay.  Trauma to the tooth that allows bacteria to enter into the pulp, such as a broken or chipped tooth. SYMPTOMS   Severe pain in and around the infected tooth.  Swelling and redness around the abscessed tooth or in the mouth or face.  Tenderness.  Pus drainage.  Bad breath.  Bitter taste in the mouth.  Difficulty swallowing.  Difficulty opening the mouth.  Nausea.  Vomiting.  Chills.  Swollen neck glands. DIAGNOSIS   A medical and dental history will be taken.  An examination will be performed by tapping on the abscessed tooth.  X-rays may be taken of the tooth to identify the abscess. TREATMENT The goal of treatment is to eliminate the infection. You may be prescribed antibiotic medicine to stop the infection from spreading. A root canal may be performed to save the tooth. If the tooth cannot be saved, it may be pulled (extracted) and the abscess may be drained.  HOME CARE INSTRUCTIONS  Only take over-the-counter or prescription medicines for pain, fever, or discomfort as directed by your caregiver.  Rinse your mouth (gargle) often with salt water ( tsp salt in 8 oz [250 ml] of warm water) to relieve pain or swelling.  Do not drive after taking pain medicine (narcotics).  Do not apply heat to the outside of your face.  Return to your dentist for further treatment as directed. SEEK MEDICAL CARE IF:  Your pain is not helped by medicine.  Your pain is  getting worse instead of better. SEEK IMMEDIATE MEDICAL CARE IF:  You have a fever or persistent symptoms for more than 2-3 days.  You have a fever and your symptoms suddenly get worse.  You have chills or a very bad headache.  You have problems breathing or swallowing.  You have trouble opening your mouth.  You have swelling in the neck or around the eye. Document Released: 02/03/2005 Document Revised: 10/29/2011 Document Reviewed: 05/14/2010 [facility] Patient Information 2015 [place], [place]. This information is not intended to replace advice given to you by your health care provider. Make sure you discuss any questions you have with your health care provider.

## 2013-09-26 NOTE — ED Provider Notes (Signed)
CSN: [id number]     Arrival date & time 09/26/13  1743 History   First MD Initiated Contact with Patient 09/26/13 1840     Chief Complaint  Patient presents with  . Dental Pain   (Consider location/radiation/quality/duration/timing/severity/associated sxs/prior Treatment) HPI  R lower jaw pain adn swelling. Started this morning. Waiting to get in to see dentist for a broken tooth. Pain ongoing for 10 days ever since cracking tooth. Area sensitive to hot and cold foods. Ibuprofen w/ some benefit. Denies purulent discharge or blood, fevers, n/v, rash.    Past Medical History  Diagnosis Date  . Asthma     last use of inhaler 5 yrs ago  . Enlarged thyroid gland     left side - f/u with md yearly  . SVD (spontaneous vaginal delivery)     x 4   Past Surgical History  Procedure Laterality Date  . Breast cyst excision      left  . Eye surgery      left eye surgery  . Repair of vaginal tear   1986    after SVD  . Ablation    . Tubal ligation    . Hysteroscopy w/d&c N/A 11/24/2012    Procedure: DILATATION AND CURETTAGE /HYSTEROSCOPY;  Surgeon: Frederico Hamman, MD;  Location: [facility];  Service: Gynecology;  Laterality: N/A;   No family history on file. History  Substance Use Topics  . Smoking status: Never Smoker   . Smokeless tobacco: Never Used  . Alcohol Use: No   OB History   Grav Para Term Preterm Abortions TAB SAB Ect Mult Living                 Review of Systems Per HPI with all other pertinent systems negative.   Allergies  Aspirin  Home Medications   Prior to Admission medications   Medication Sig Start Date End Date Taking? Authorizing Provider  chlorhexidine (PERIDEX) 0.12 % solution Use as directed 15 mLs in the mouth or throat 2 (two) times daily. 09/26/13   Waldemar Dickens, MD  clindamycin (CLEOCIN) 300 MG capsule Take 1 capsule (300 mg total) by mouth 3 (three) times daily. 09/26/13   Waldemar Dickens, MD  HYDROcodone-acetaminophen (NORCO/VICODIN) 5-325 MG  per tablet Take 1 tablet by mouth every 6 (six) hours as needed for moderate pain. 06/12/13   Resa Miner Lawyer, PA-C  ibuprofen (ADVIL,MOTRIN) 800 MG tablet Take 1 tablet (800 mg total) by mouth every 8 (eight) hours as needed. 06/12/13   Resa Miner Lawyer, PA-C   BP 133/83  Pulse 60  Temp(Src) 98.3 F (36.8 C) (Oral)  Resp 16  SpO2 100% Physical Exam  Constitutional: She is oriented to person, place, and time. She appears well-developed and well-nourished.  HENT:  Oral R mandiblular swelling adn ttp near the 3rd and 4th molars towards the back. Multipl caries w/ broken off teeth No purulent or bloody discharge.   Eyes: EOM are normal. Pupils are equal, round, and reactive to light.  Neck: Normal range of motion.  Cardiovascular: Normal rate, normal heart sounds and intact distal pulses.   No murmur heard. Pulmonary/Chest: Effort normal and breath sounds normal.  Abdominal: Soft.  Musculoskeletal: Normal range of motion.  Neurological: She is alert and oriented to person, place, and time. She exhibits normal muscle tone.  Psychiatric: She has a normal mood and affect. Her behavior is normal. Judgment and thought content normal.    ED Course  Procedures (including critical  care time) Labs Review Labs Reviewed - No data to display  Imaging Review No results found.   MDM   1. Dental infection   2. Dental caries    Clindamycin x 10 days (dental appt in 14 days)   1 refill given Chlorhexidine MT for prevention in the future Motrin 800 in office Precautions given and all questions answered   Linna Darner, MD Family Medicine 09/26/2013, 6:55 PM      Waldemar Dickens, MD 09/26/13 [phone]

## 2013-09-26 NOTE — ED Notes (Signed)
Pt c/o right dental pain on right lower side Alert w/no signs of acute distress.

## 2013-12-28 ENCOUNTER — Encounter (HOSPITAL_COMMUNITY): Payer: Self-pay | Admitting: Emergency Medicine

## 2013-12-28 ENCOUNTER — Emergency Department (HOSPITAL_COMMUNITY)
Admission: EM | Admit: 2013-12-28 | Discharge: 2013-12-28 | Disposition: A | Discharge status: Home / Self Care | Payer: 59 | Attending: Emergency Medicine | Admitting: Emergency Medicine

## 2013-12-28 DIAGNOSIS — Z8673 Personal history of transient ischemic attack (TIA), and cerebral infarction without residual deficits: Secondary | ICD-10-CM | POA: Insufficient documentation

## 2013-12-28 DIAGNOSIS — Z8639 Personal history of other endocrine, nutritional and metabolic disease: Secondary | ICD-10-CM | POA: Diagnosis not present

## 2013-12-28 DIAGNOSIS — J45909 Unspecified asthma, uncomplicated: Secondary | ICD-10-CM | POA: Insufficient documentation

## 2013-12-28 DIAGNOSIS — N764 Abscess of vulva: Secondary | ICD-10-CM | POA: Insufficient documentation

## 2013-12-28 DIAGNOSIS — Z3202 Encounter for pregnancy test, result negative: Secondary | ICD-10-CM | POA: Insufficient documentation

## 2013-12-28 DIAGNOSIS — Z792 Long term (current) use of antibiotics: Secondary | ICD-10-CM | POA: Insufficient documentation

## 2013-12-28 DIAGNOSIS — N9089 Other specified noninflammatory disorders of vulva and perineum: Secondary | ICD-10-CM | POA: Diagnosis present

## 2013-12-28 HISTORY — DX: Transient cerebral ischemic attack, unspecified: G45.9

## 2013-12-28 LAB — WET PREP, GENITAL
Clue Cells Wet Prep HPF POC: NONE SEEN
Trich, Wet Prep: NONE SEEN
YEAST WET PREP: NONE SEEN

## 2013-12-28 LAB — POC URINE PREG, ED: Preg Test, Ur: NEGATIVE

## 2013-12-28 MED ORDER — LIDOCAINE-EPINEPHRINE 2 %-1:100000 IJ SOLN
30.0000 mL | Freq: Once | INTRAMUSCULAR | Status: DC
  Filled 2013-12-28: qty 2

## 2013-12-28 MED ORDER — DOXYCYCLINE HYCLATE 100 MG PO CAPS: 100.0000 mg | ORAL_CAPSULE | Freq: Two times a day (BID) | ORAL | Status: DC

## 2013-12-28 MED ORDER — LIDOCAINE-EPINEPHRINE (PF) 2 %-1:200000 IJ SOLN: 10.0000 mL | Freq: Once | INTRAMUSCULAR | Status: DC

## 2013-12-28 MED ORDER — OXYCODONE-ACETAMINOPHEN 5-325 MG PO TABS: 2.0000 | ORAL_TABLET | Freq: Four times a day (QID) | ORAL | Status: DC | PRN

## 2013-12-28 NOTE — ED Notes (Signed)
Pt reports having a weak bladder, states she started using poise and about a week ago noticed blisters around vaginal area. Denies any drainage. Pt states she thinks it might be a reaction to poise. Pt is sexually active. Denies any urinary sx.

## 2013-12-28 NOTE — ED Provider Notes (Signed)
CSN: [id number]     Arrival date & time 12/28/13  1840 History   First MD Initiated Contact with Patient 12/28/13 2011     Chief Complaint  Patient presents with  . blisters      (Consider location/radiation/quality/duration/timing/severity/associated sxs/prior Treatment) HPI Comments: The patient is a 48 year old female presenting to the emergency department the chief complaint of genital lesions for approximately 1 week if. Patient reports multiple painful lesions. Patient attributes lesions to wearing poise for urinary incontinence. Reports having pelvic region. No history of herpes, or similar lesions.   The history is provided by the patient. No language interpreter was used.    Past Medical History  Diagnosis Date  . Asthma     last use of inhaler 5 yrs ago  . Enlarged thyroid gland     left side - f/u with md yearly  . SVD (spontaneous vaginal delivery)     x 4  . TIA (transient ischemic attack)    Past Surgical History  Procedure Laterality Date  . Breast cyst excision      left  . Eye surgery      left eye surgery  . Repair of vaginal tear   1986    after SVD  . Ablation    . Tubal ligation    . Hysteroscopy w/d&c N/A 11/24/2012    Procedure: DILATATION AND CURETTAGE /HYSTEROSCOPY;  Surgeon: Frederico Hamman, MD;  Location: [facility];  Service: Gynecology;  Laterality: N/A;   No family history on file. History  Substance Use Topics  . Smoking status: Never Smoker   . Smokeless tobacco: Never Used  . Alcohol Use: No   OB History    No data available     Review of Systems  Constitutional: Negative for fever and chills.  Gastrointestinal: Negative for abdominal pain.  Genitourinary: Positive for genital sores. Negative for dysuria, hematuria and vaginal discharge.      Allergies  Aspirin  Home Medications   Prior to Admission medications   Medication Sig Start Date End Date Taking? Authorizing Provider  ibuprofen (ADVIL,MOTRIN) 200 MG tablet Take  200 mg by mouth every 6 (six) hours as needed for moderate pain (ankle pain).   Yes Historical Provider, MD  chlorhexidine (PERIDEX) 0.12 % solution Use as directed 15 mLs in the mouth or throat 2 (two) times daily. 09/26/13   Waldemar Dickens, MD  clindamycin (CLEOCIN) 300 MG capsule Take 1 capsule (300 mg total) by mouth 3 (three) times daily. 09/26/13   Waldemar Dickens, MD  fluconazole (DIFLUCAN) 150 MG tablet Take 1 tablet (150 mg total) by mouth daily. Repeat dose in 3 days 09/26/13   Waldemar Dickens, MD  HYDROcodone-acetaminophen (NORCO/VICODIN) 5-325 MG per tablet Take 1 tablet by mouth every 6 (six) hours as needed for moderate pain. 06/12/13   Resa Miner Lawyer, PA-C  ibuprofen (ADVIL,MOTRIN) 800 MG tablet Take 1 tablet (800 mg total) by mouth every 8 (eight) hours as needed. 06/12/13   Resa Miner Lawyer, PA-C   BP 118/77 mmHg  Pulse 79  Temp(Src) 98.3 F (36.8 C) (Oral)  Resp 16  Ht 5\' 8"  (1.727 m)  Wt 172 lb (78.019 kg)  BMI 26.16 kg/m2  SpO2 98% Physical Exam  Constitutional: She appears well-developed and well-nourished. No distress.  HENT:  Head: Atraumatic.  Pulmonary/Chest: Effort normal. No respiratory distress.  Abdominal: Soft. There is no tenderness. There is no rebound and no guarding.  Genitourinary:    There is lesion  on the right labia. There is lesion on the left labia.  Multiple area of swellings to bilateral labia with central fluctuance. No overlying erythema.  Skin: Skin is warm and dry.  Psychiatric: She has a normal mood and affect. Her behavior is normal.  Nursing note and vitals reviewed.   ED Course  INCISION AND DRAINAGE Date/Time: 12/28/2013 9:40 PM Performed by: Harvie Heck Authorized by: Harvie Heck Consent: Verbal consent obtained. Risks and benefits: risks, benefits and alternatives were discussed Consent given by: patient Patient understanding: patient states understanding of the procedure being performed Patient consent: the  patient's understanding of the procedure matches consent given Required items: required blood products, implants, devices, and special equipment available Patient identity confirmed: verbally with patient Time out: Immediately prior to procedure a "time out" was called to verify the correct patient, procedure, equipment, support staff and site/side marked as required. Type: abscess Body area: anogenital Location details: vulva Anesthesia: local infiltration Local anesthetic: lidocaine 2% with epinephrine Anesthetic total: 2 ml Patient sedated: no Scalpel size: 11 Needle gauge: 22 Incision type: single straight Complexity: complex Drainage: purulent Drainage amount: moderate Wound treatment: wound left open Packing material: 1/4 in iodoform gauze Patient tolerance: Patient tolerated the procedure well with no immediate complications   (including critical care time) INCISION AND DRAINAGE Date/Time: 12/28/2013 9:40 PM Performed by: Harvie Heck Authorized by: Harvie Heck Consent: Verbal consent obtained. Risks and benefits: risks, benefits and alternatives were discussed Consent given by: patient Patient understanding: patient states understanding of the procedure being performed Patient consent: the patient's understanding of the procedure matches consent given Required items: required blood products, implants, devices, and special equipment available Patient identity confirmed: verbally with patient Time out: Immediately prior to procedure a "time out" was called to verify the correct patient, procedure, equipment, support staff and site/side marked as required. Type: abscess Body area: anogenital Location details: right vulva Anesthesia: local infiltration Local anesthetic: lidocaine 2% with epinephrine Anesthetic total: 2 ml Patient sedated: no Scalpel size: 11 Needle gauge: 22 Incision type: single straight Complexity: complex Drainage: purulent Drainage amount:  moderate Wound treatment: wound left open Packing material: none Patient tolerance: Patient tolerated the procedure well with no immediate complications INCISION AND DRAINAGE Date/Time: 12/28/2013 9:40 PM Performed by: Harvie Heck Authorized by: Harvie Heck Consent: Verbal consent obtained. Risks and benefits: risks, benefits and alternatives were discussed Consent given by: patient Patient understanding: patient states understanding of the procedure being performed Patient consent: the patient's understanding of the procedure matches consent given Required items: required blood products, implants, devices, and special equipment available Patient identity confirmed: verbally with patient Time out: Immediately prior to procedure a "time out" was called to verify the correct patient, procedure, equipment, support staff and site/side marked as required. Type: abscess Body area: anogenital Location details: left distal vulva Anesthesia: local infiltration Local anesthetic: lidocaine 2% with epinephrine Anesthetic total: 2 ml Patient sedated: no Scalpel size: 11 Needle gauge: 22 Incision type: single straight Complexity: complex Drainage: purulent Drainage amount: moderate Wound treatment: wound left open Packing material: 1/4 in iodoform gauze Patient tolerance: Patient tolerated the procedure well with no immediate complications Labs Review Labs Reviewed - No data to display  Imaging Review No results found.   EKG Interpretation None      MDM   Final diagnoses:  Vulvar abscess       Patient presents with multiple vaginal lesions, consistent with abscesses. I and D performed patient tolerated well. Plan to follow-up with gynecologist for packing  removal and further evaluation in 48 hours. Meds given in ED:  Medications - No data to display  Discharge Medication List as of 12/28/2013 10:08 PM    START taking these medications   Details  doxycycline  (VIBRAMYCIN) 100 MG capsule Take 1 capsule (100 mg total) by mouth 2 (two) times daily., Starting 12/28/2013, Until Discontinued, Print    oxyCODONE-acetaminophen (PERCOCET/ROXICET) 5-325 MG per tablet Take 2 tablets by mouth every 6 (six) hours as needed for moderate pain or severe pain., Starting 12/28/2013, Until Discontinued, Print         Harvie Heck, PA-C 12/29/13 [place]. Alvino Chapel, MD 12/30/13 [phone]

## 2013-12-28 NOTE — Discharge Instructions (Signed)
Call a gynecologist for further evaluation of your genital abscesses and packing removal in 2 days. Call for a follow up appointment with a Family or Primary Care Provider.  Return if Symptoms worsen.   Take medication as prescribed.  Do not soak the area until the packing has been removed. Use a warm compress and pressure to aid in drainage. Do not shave your pelvic area to avoid recurrence.

## 2013-12-29 LAB — GC/CHLAMYDIA PROBE AMP
CT PROBE, AMP APTIMA: NEGATIVE
GC Probe RNA: NEGATIVE

## 2013-12-30 ENCOUNTER — Inpatient Hospital Stay (HOSPITAL_COMMUNITY)
Admission: AD | Admit: 2013-12-30 | Discharge: 2013-12-30 | Disposition: A | Discharge status: Home / Self Care | Payer: 59 | Source: Ambulatory Visit | Attending: Obstetrics | Admitting: Obstetrics

## 2013-12-30 ENCOUNTER — Encounter (HOSPITAL_COMMUNITY): Payer: Self-pay | Admitting: *Deleted

## 2013-12-30 DIAGNOSIS — N760 Acute vaginitis: Secondary | ICD-10-CM | POA: Diagnosis present

## 2013-12-30 MED ORDER — HYDROMORPHONE HCL 1 MG/ML IJ SOLN
1.0000 mg | Freq: Once | INTRAMUSCULAR | Status: AC
  Administered 2013-12-30: 1 mg via INTRAMUSCULAR
  Filled 2013-12-30: qty 1

## 2013-12-30 NOTE — Discharge Instructions (Signed)

## 2013-12-30 NOTE — MAU Provider Note (Signed)
History     CSN: [id number]  Arrival date and time: 12/30/13 8527   First Provider Initiated Contact with Patient 12/30/13 1903      Chief Complaint  Patient presents with  . Dressing Change   HPI Comments: Susan Berry 48 y.o. G4P4 presents to MAU for removal of packing from I&D on 12/28/13. She has not started her antibiotics yet, but will tonight. She is still having pain 5/10 at site and would like to be medicated for the procedure.      Past Medical History  Diagnosis Date  . Asthma     last use of inhaler 5 yrs ago  . Enlarged thyroid gland     left side - f/u with md yearly  . SVD (spontaneous vaginal delivery)     x 4  . TIA (transient ischemic attack)     Past Surgical History  Procedure Laterality Date  . Breast cyst excision      left  . Eye surgery      left eye surgery  . Repair of vaginal tear   1986    after SVD  . Ablation    . Tubal ligation    . Hysteroscopy w/d&c N/A 11/24/2012    Procedure: DILATATION AND CURETTAGE /HYSTEROSCOPY;  Surgeon: Frederico Hamman, MD;  Location: [facility];  Service: Gynecology;  Laterality: N/A;    History reviewed. No pertinent family history.  History  Substance Use Topics  . Smoking status: Never Smoker   . Smokeless tobacco: Never Used  . Alcohol Use: No    Allergies:  Allergies  Allergen Reactions  . Aspirin Other (See Comments)    Causes asthma and bronchitis flare ups Does not take other NSAIDs either    Prescriptions prior to admission  Medication Sig Dispense Refill Last Dose  . chlorhexidine (PERIDEX) 0.12 % solution Use as directed 15 mLs in the mouth or throat 2 (two) times daily. 120 mL 0   . clindamycin (CLEOCIN) 300 MG capsule Take 1 capsule (300 mg total) by mouth 3 (three) times daily. 30 capsule 1   . doxycycline (VIBRAMYCIN) 100 MG capsule Take 1 capsule (100 mg total) by mouth 2 (two) times daily. 20 capsule 0   . fluconazole (DIFLUCAN) 150 MG tablet Take 1 tablet (150 mg total) by  mouth daily. Repeat dose in 3 days 2 tablet 0   . HYDROcodone-acetaminophen (NORCO/VICODIN) 5-325 MG per tablet Take 1 tablet by mouth every 6 (six) hours as needed for moderate pain. 15 tablet 0 unknown at unknown time  . ibuprofen (ADVIL,MOTRIN) 200 MG tablet Take 200 mg by mouth every 6 (six) hours as needed for moderate pain (ankle pain).   Past Month at Unknown time  . ibuprofen (ADVIL,MOTRIN) 800 MG tablet Take 1 tablet (800 mg total) by mouth every 8 (eight) hours as needed. 21 tablet 0 unknown at unknown time  . oxyCODONE-acetaminophen (PERCOCET/ROXICET) 5-325 MG per tablet Take 2 tablets by mouth every 6 (six) hours as needed for moderate pain or severe pain. 10 tablet 0     Review of Systems  Constitutional: Negative.   HENT: Negative.   Eyes: Negative.   Respiratory: Negative.   Cardiovascular: Negative.   Gastrointestinal: Negative.   Genitourinary:       Painful vulva  Musculoskeletal: Negative.   Skin: Negative.   Neurological: Negative.   Psychiatric/Behavioral: Negative.    Physical Exam   Blood pressure 128/83, pulse 69, temperature 99.2 F (37.3 C), temperature source Oral,  resp. rate 18, height 5' 6.5" (1.689 m), weight 78.926 kg (174 lb).  Physical Exam  Constitutional: She is oriented to person, place, and time. She appears well-developed and well-nourished. No distress.  HENT:  Head: Normocephalic and atraumatic.  GI: Soft.  Genitourinary:  Genital:External multiple lesions/ right labia largest one is healing and there is no packing   Musculoskeletal: Normal range of motion.  Neurological: She is alert and oriented to person, place, and time.  Skin: Skin is warm.  Psychiatric: She has a normal mood and affect. Her behavior is normal. Judgment and thought content normal.    MAU Course  Procedures  MDM  Dilaudid 1mg  IM prior to packing removal/ packing was not there when examined  Assessment and Plan   A: Vaginal abscess   P: Advised to start  antibiotics tonight Advised to use hot soak/ use pain meds as needed Follow up as needed  Georgia Duff 12/30/2013, 7:16 PM

## 2013-12-30 NOTE — MAU Note (Signed)
Went to [facility], had some cyst - drained and packed. Was told to come here to have gauze removed.

## 2014-06-15 ENCOUNTER — Emergency Department (HOSPITAL_COMMUNITY)
Admission: EM | Admit: 2014-06-15 | Discharge: 2014-06-15 | Disposition: A | Discharge status: Home / Self Care | Payer: 59 | Attending: Emergency Medicine | Admitting: Emergency Medicine

## 2014-06-15 ENCOUNTER — Encounter (HOSPITAL_COMMUNITY): Payer: Self-pay | Admitting: Emergency Medicine

## 2014-06-15 ENCOUNTER — Emergency Department (HOSPITAL_COMMUNITY): Payer: 59

## 2014-06-15 DIAGNOSIS — Z8673 Personal history of transient ischemic attack (TIA), and cerebral infarction without residual deficits: Secondary | ICD-10-CM | POA: Diagnosis not present

## 2014-06-15 DIAGNOSIS — R0602 Shortness of breath: Secondary | ICD-10-CM

## 2014-06-15 DIAGNOSIS — Z8639 Personal history of other endocrine, nutritional and metabolic disease: Secondary | ICD-10-CM | POA: Insufficient documentation

## 2014-06-15 DIAGNOSIS — Z792 Long term (current) use of antibiotics: Secondary | ICD-10-CM | POA: Insufficient documentation

## 2014-06-15 DIAGNOSIS — J45901 Unspecified asthma with (acute) exacerbation: Secondary | ICD-10-CM | POA: Diagnosis not present

## 2014-06-15 DIAGNOSIS — Z79899 Other long term (current) drug therapy: Secondary | ICD-10-CM | POA: Diagnosis not present

## 2014-06-15 LAB — CBC WITH DIFFERENTIAL/PLATELET
BASOS PCT: 0 % (ref 0–1)
Basophils Absolute: 0 10*3/uL (ref 0.0–0.1)
Eosinophils Absolute: 0.3 10*3/uL (ref 0.0–0.7)
Eosinophils Relative: 4 % (ref 0–5)
HEMATOCRIT: 41.5 % (ref 36.0–46.0)
HEMOGLOBIN: 13.4 g/dL (ref 12.0–15.0)
LYMPHS PCT: 28 % (ref 12–46)
Lymphs Abs: 2.1 10*3/uL (ref 0.7–4.0)
MCH: 29.9 pg (ref 26.0–34.0)
MCHC: 32.3 g/dL (ref 30.0–36.0)
MCV: 92.6 fL (ref 78.0–100.0)
MONO ABS: 0.5 10*3/uL (ref 0.1–1.0)
MONOS PCT: 7 % (ref 3–12)
NEUTROS ABS: 4.6 10*3/uL (ref 1.7–7.7)
Neutrophils Relative %: 61 % (ref 43–77)
Platelets: 289 10*3/uL (ref 150–400)
RBC: 4.48 MIL/uL (ref 3.87–5.11)
RDW: 12 % (ref 11.5–15.5)
WBC: 7.4 10*3/uL (ref 4.0–10.5)

## 2014-06-15 LAB — COMPREHENSIVE METABOLIC PANEL
ALT: 12 U/L (ref 0–35)
ANION GAP: 4 — AB (ref 5–15)
AST: 19 U/L (ref 0–37)
Albumin: 4.3 g/dL (ref 3.5–5.2)
Alkaline Phosphatase: 93 U/L (ref 39–117)
BUN: 17 mg/dL (ref 6–23)
CHLORIDE: 107 mmol/L (ref 96–112)
CO2: 26 mmol/L (ref 19–32)
CREATININE: 0.89 mg/dL (ref 0.50–1.10)
Calcium: 9.3 mg/dL (ref 8.4–10.5)
GFR calc Af Amer: 87 mL/min — ABNORMAL LOW (ref >90)
GFR calc non Af Amer: 75 mL/min — ABNORMAL LOW (ref >90)
GLUCOSE: 109 mg/dL — AB (ref 70–99)
POTASSIUM: 3.7 mmol/L (ref 3.5–5.1)
Sodium: 137 mmol/L (ref 135–145)
Total Bilirubin: 1 mg/dL (ref 0.3–1.2)
Total Protein: 7.7 g/dL (ref 6.0–8.3)

## 2014-06-15 LAB — BRAIN NATRIURETIC PEPTIDE: B Natriuretic Peptide: 21.5 pg/mL (ref 0.0–100.0)

## 2014-06-15 LAB — TROPONIN I: Troponin I: <0.03 ng/mL (ref <0.031)

## 2014-06-15 MED ORDER — PREDNISONE 20 MG PO TABS: ORAL_TABLET | ORAL | Status: DC

## 2014-06-15 MED ORDER — ALBUTEROL SULFATE HFA 108 (90 BASE) MCG/ACT IN AERS
1.0000 | INHALATION_SPRAY | RESPIRATORY_TRACT | Status: DC | PRN
  Administered 2014-06-15: 2 via RESPIRATORY_TRACT
  Filled 2014-06-15: qty 6.7

## 2014-06-15 MED ORDER — METHYLPREDNISOLONE SODIUM SUCC 125 MG IJ SOLR
INTRAMUSCULAR | Status: AC
  Filled 2014-06-15: qty 2

## 2014-06-15 MED ORDER — METHYLPREDNISOLONE SODIUM SUCC 125 MG IJ SOLR
125.0000 mg | Freq: Once | INTRAMUSCULAR | Status: AC
  Administered 2014-06-15: 125 mg via INTRAVENOUS

## 2014-06-15 MED ORDER — ALBUTEROL (5 MG/ML) CONTINUOUS INHALATION SOLN
10.0000 mg/h | INHALATION_SOLUTION | RESPIRATORY_TRACT | Status: DC
  Administered 2014-06-15: 10 mg/h via RESPIRATORY_TRACT
  Filled 2014-06-15: qty 20

## 2014-06-15 NOTE — Discharge Instructions (Signed)

## 2014-06-15 NOTE — ED Notes (Signed)
Pt is c/o shortness of breath  Pt states it started a couple hours ago  Pt is unable to speak full sentences  Pt's resp labored and through pursed lips

## 2014-06-15 NOTE — ED Provider Notes (Signed)
CSN: [id number]     Arrival date & time 06/15/14  0236 History   First MD Initiated Contact with Patient 06/15/14 0247     Chief Complaint  Patient presents with  . Shortness of Breath     (Consider location/radiation/quality/duration/timing/severity/associated sxs/prior Treatment) HPI Patient presents with progressive shortness of breath over the last few hours. States is associated with cough. She has a history of asthma but does not have an inhaler at home. Denies fever or chills. Denies pain. History limited due to respiratory distress. Past Medical History  Diagnosis Date  . Asthma     last use of inhaler 5 yrs ago  . Enlarged thyroid gland     left side - f/u with md yearly  . SVD (spontaneous vaginal delivery)     x 4  . TIA (transient ischemic attack)    Past Surgical History  Procedure Laterality Date  . Breast cyst excision      left  . Eye surgery      left eye surgery  . Repair of vaginal tear   1986    after SVD  . Ablation    . Tubal ligation    . Hysteroscopy w/d&c N/A 11/24/2012    Procedure: DILATATION AND CURETTAGE /HYSTEROSCOPY;  Surgeon: Frederico Hamman, MD;  Location: [facility];  Service: Gynecology;  Laterality: N/A;   History reviewed. No pertinent family history. History  Substance Use Topics  . Smoking status: Never Smoker   . Smokeless tobacco: Never Used  . Alcohol Use: No   OB History    Gravida Para Term Preterm AB TAB SAB Ectopic Multiple Living   4 4        4      Review of Systems  Constitutional: Negative for fever and chills.  Respiratory: Positive for cough, shortness of breath and wheezing.   Cardiovascular: Negative for chest pain and leg swelling.  Gastrointestinal: Negative for nausea, vomiting and abdominal pain.  Musculoskeletal: Negative for back pain.  Neurological: Negative for weakness and numbness.  All other systems reviewed and are negative.     Allergies  Aspirin and Nsaids  Home Medications   Prior to  Admission medications   Medication Sig Start Date End Date Taking? Authorizing Provider  DM-Phenylephrine-Acetaminophen (QC FLU RELIEF THERAPY DAYTIME PO) Take 2 tablets by mouth every 4 (four) hours as needed (cold symptoms).   Yes Historical Provider, MD  ibuprofen (ADVIL,MOTRIN) 200 MG tablet Take 400 mg by mouth every 6 (six) hours as needed for mild pain or moderate pain.    Yes Historical Provider, MD  Multiple Vitamins-Minerals (AIRBORNE PO) Take 2 tablets by mouth every 4 (four) hours as needed (cold symptoms).   Yes Historical Provider, MD  chlorhexidine (PERIDEX) 0.12 % solution Use as directed 15 mLs in the mouth or throat 2 (two) times daily. Patient not taking: Reported on 12/30/2013 09/26/13   Waldemar Dickens, MD  clindamycin (CLEOCIN) 300 MG capsule Take 1 capsule (300 mg total) by mouth 3 (three) times daily. Patient not taking: Reported on 12/30/2013 09/26/13   Waldemar Dickens, MD  doxycycline (VIBRAMYCIN) 100 MG capsule Take 1 capsule (100 mg total) by mouth 2 (two) times daily. Patient not taking: Reported on 06/15/2014 12/28/13   Harvie Heck, PA-C  fluconazole (DIFLUCAN) 150 MG tablet Take 1 tablet (150 mg total) by mouth daily. Repeat dose in 3 days Patient not taking: Reported on 12/30/2013 09/26/13   Waldemar Dickens, MD  HYDROcodone-acetaminophen (NORCO/VICODIN) 5-325 MG  per tablet Take 1 tablet by mouth every 6 (six) hours as needed for moderate pain. Patient not taking: Reported on 12/30/2013 06/12/13   Dalia Heading, PA-C  ibuprofen (ADVIL,MOTRIN) 800 MG tablet Take 1 tablet (800 mg total) by mouth every 8 (eight) hours as needed. Patient not taking: Reported on 12/30/2013 06/12/13   Dalia Heading, PA-C  oxyCODONE-acetaminophen (PERCOCET/ROXICET) 5-325 MG per tablet Take 2 tablets by mouth every 6 (six) hours as needed for moderate pain or severe pain. Patient not taking: Reported on 12/30/2013 12/28/13   Harvie Heck, PA-C  predniSONE (DELTASONE) 20 MG tablet 3  tabs po day one, then 2 po daily x 4 days 06/15/14   Julianne Rice, MD   BP 132/75 mmHg  Pulse 87  Temp(Src) 97.5 F (36.4 C) (Oral)  Resp 22  SpO2 100% Physical Exam  Constitutional: She is oriented to person, place, and time. She appears well-developed and well-nourished. No distress.  HENT:  Head: Normocephalic and atraumatic.  Mouth/Throat: Oropharynx is clear and moist.  Eyes: EOM are normal. Pupils are equal, round, and reactive to light.  Neck: Normal range of motion. Neck supple.  Cardiovascular: Normal rate and regular rhythm.   Pulmonary/Chest: She is in respiratory distress. She has wheezes. She has no rales.  Increased respiratory effort. Decreased air movement. Prolonged expiratory phase.  Abdominal: Soft. Bowel sounds are normal. She exhibits no distension and no mass. There is no tenderness. There is no rebound and no guarding.  Musculoskeletal: Normal range of motion. She exhibits no edema or tenderness.  No calf swelling or tenderness.  Neurological: She is alert and oriented to person, place, and time.  Skin: Skin is warm and dry. No rash noted. No erythema.  Psychiatric: Her behavior is normal.  Anxious appearing  Nursing note and vitals reviewed.   ED Course  Procedures (including critical care time) Labs Review Labs Reviewed  COMPREHENSIVE METABOLIC PANEL - Abnormal; Notable for the following:    Glucose, Bld 109 (*)    GFR calc non Af Amer 75 (*)    GFR calc Af Amer 87 (*)    Anion gap 4 (*)    All other components within normal limits  CBC WITH DIFFERENTIAL/PLATELET  BRAIN NATRIURETIC PEPTIDE  TROPONIN I    Imaging Review Dg Chest Port 1 View  06/15/2014   CLINICAL DATA:  Acute onset of shortness of breath. Initial encounter.  EXAM: PORTABLE CHEST - 1 VIEW  COMPARISON:  Chest radiograph performed 06/12/2013  FINDINGS: The lungs are well-aerated. Mild vascular congestion is noted. There is no evidence of focal opacification, pleural effusion or  pneumothorax.  The cardiomediastinal silhouette is within normal limits. No acute osseous abnormalities are seen.  IMPRESSION: Mild vascular congestion noted; lungs remain grossly clear.   Electronically Signed   By: Garald Balding M.D.   On: 06/15/2014 03:30     EKG Interpretation None      MDM   Final diagnoses:  SOB (shortness of breath)  Asthma exacerbation    Patient's breathing is significantly improved with nebulized treatment. No further wheezing. We'll discharge home with short course of prednisone. Inhaler given in the emergency department. Return precautions given.    Julianne Rice, MD 06/15/14 [phone]

## 2015-04-05 ENCOUNTER — Encounter (HOSPITAL_COMMUNITY): Payer: Self-pay

## 2015-04-05 ENCOUNTER — Emergency Department (HOSPITAL_COMMUNITY)
Admission: EM | Admit: 2015-04-05 | Discharge: 2015-04-05 | Disposition: A | Discharge status: Home / Self Care | Payer: 59 | Attending: Emergency Medicine | Admitting: Emergency Medicine

## 2015-04-05 DIAGNOSIS — J45909 Unspecified asthma, uncomplicated: Secondary | ICD-10-CM | POA: Diagnosis not present

## 2015-04-05 DIAGNOSIS — Z8673 Personal history of transient ischemic attack (TIA), and cerebral infarction without residual deficits: Secondary | ICD-10-CM | POA: Diagnosis not present

## 2015-04-05 DIAGNOSIS — Z8639 Personal history of other endocrine, nutritional and metabolic disease: Secondary | ICD-10-CM | POA: Diagnosis not present

## 2015-04-05 DIAGNOSIS — J028 Acute pharyngitis due to other specified organisms: Secondary | ICD-10-CM

## 2015-04-05 DIAGNOSIS — B9789 Other viral agents as the cause of diseases classified elsewhere: Secondary | ICD-10-CM

## 2015-04-05 DIAGNOSIS — J029 Acute pharyngitis, unspecified: Secondary | ICD-10-CM | POA: Diagnosis present

## 2015-04-05 LAB — T4, FREE: Free T4: 0.8 ng/dL (ref 0.61–1.12)

## 2015-04-05 LAB — TSH: TSH: 0.876 u[IU]/mL (ref 0.350–4.500)

## 2015-04-05 MED ORDER — DEXAMETHASONE 4 MG PO TABS
10.0000 mg | ORAL_TABLET | Freq: Once | ORAL | Status: AC
  Administered 2015-04-05: 10 mg via ORAL
  Filled 2015-04-05: qty 2

## 2015-04-05 NOTE — ED Provider Notes (Signed)
CSN: [id number]     Arrival date & time 04/05/15  [id number] History   First MD Initiated Contact with Patient 04/05/15 [phone]     Chief Complaint  Patient presents with  . Thyroid Nodule     (Consider location/radiation/quality/duration/timing/severity/associated sxs/prior Treatment) Patient is a 50 y.o. female presenting with pharyngitis. The history is provided by the patient.  Sore Throat This is a new problem. The current episode started 2 days ago. The problem occurs constantly. The problem has not changed since onset.Pertinent negatives include no chest pain, no abdominal pain and no shortness of breath. Nothing aggravates the symptoms. Nothing relieves the symptoms. She has tried nothing for the symptoms.    Past Medical History  Diagnosis Date  . Asthma     last use of inhaler 5 yrs ago  . Enlarged thyroid gland     left side - f/u with md yearly  . SVD (spontaneous vaginal delivery)     x 4  . TIA (transient ischemic attack)    Past Surgical History  Procedure Laterality Date  . Breast cyst excision      left  . Eye surgery      left eye surgery  . Repair of vaginal tear   1986    after SVD  . Ablation    . Tubal ligation    . Hysteroscopy w/d&c N/A 11/24/2012    Procedure: DILATATION AND CURETTAGE /HYSTEROSCOPY;  Surgeon: Frederico Hamman, MD;  Location: [facility];  Service: Gynecology;  Laterality: N/A;   History reviewed. No pertinent family history. Social History  Substance Use Topics  . Smoking status: Never Smoker   . Smokeless tobacco: Never Used  . Alcohol Use: No   OB History    Gravida Para Term Preterm AB TAB SAB Ectopic Multiple Living   4 4        4      Review of Systems  Constitutional: Negative for fever.  Respiratory: Positive for cough (waking from sleep). Negative for shortness of breath.   Cardiovascular: Negative for chest pain.  Gastrointestinal: Negative for abdominal pain and diarrhea.  Endocrine: Negative for cold intolerance and heat  intolerance.  All other systems reviewed and are negative.     Allergies  Aspirin and Nsaids  Home Medications   Prior to Admission medications   Medication Sig Start Date End Date Taking? Authorizing Provider  chlorhexidine (PERIDEX) 0.12 % solution Use as directed 15 mLs in the mouth or throat 2 (two) times daily. Patient not taking: Reported on 12/30/2013 09/26/13   Waldemar Dickens, MD  clindamycin (CLEOCIN) 300 MG capsule Take 1 capsule (300 mg total) by mouth 3 (three) times daily. Patient not taking: Reported on 12/30/2013 09/26/13   Waldemar Dickens, MD  DM-Phenylephrine-Acetaminophen (QC FLU RELIEF THERAPY DAYTIME PO) Take 2 tablets by mouth every 4 (four) hours as needed (cold symptoms).    Historical Provider, MD  doxycycline (VIBRAMYCIN) 100 MG capsule Take 1 capsule (100 mg total) by mouth 2 (two) times daily. Patient not taking: Reported on 06/15/2014 12/28/13   Harvie Heck, PA-C  fluconazole (DIFLUCAN) 150 MG tablet Take 1 tablet (150 mg total) by mouth daily. Repeat dose in 3 days Patient not taking: Reported on 12/30/2013 09/26/13   Waldemar Dickens, MD  HYDROcodone-acetaminophen (NORCO/VICODIN) 5-325 MG per tablet Take 1 tablet by mouth every 6 (six) hours as needed for moderate pain. Patient not taking: Reported on 12/30/2013 06/12/13   Dalia Heading, PA-C  ibuprofen (ADVIL,MOTRIN) 200  MG tablet Take 400 mg by mouth every 6 (six) hours as needed for mild pain or moderate pain.     Historical Provider, MD  ibuprofen (ADVIL,MOTRIN) 800 MG tablet Take 1 tablet (800 mg total) by mouth every 8 (eight) hours as needed. Patient not taking: Reported on 12/30/2013 06/12/13   Dalia Heading, PA-C  Multiple Vitamins-Minerals (AIRBORNE PO) Take 2 tablets by mouth every 4 (four) hours as needed (cold symptoms).    Historical Provider, MD  oxyCODONE-acetaminophen (PERCOCET/ROXICET) 5-325 MG per tablet Take 2 tablets by mouth every 6 (six) hours as needed for moderate pain or severe  pain. Patient not taking: Reported on 12/30/2013 12/28/13   Harvie Heck, PA-C  predniSONE (DELTASONE) 20 MG tablet 3 tabs po day one, then 2 po daily x 4 days 06/15/14   Julianne Rice, MD   BP 137/90 mmHg  Pulse 87  Temp(Src) 98.5 F (36.9 C) (Oral)  Resp 16  SpO2 100% Physical Exam  Constitutional: She is oriented to person, place, and time. She appears well-developed and well-nourished. No distress.  HENT:  Head: Normocephalic.  Scattered palatal petechiae, right 2+ tonsil, left 1+ tonsil with minimal hemorrhagic exudate  Eyes: Conjunctivae are normal.  Neck: Neck supple. No tracheal deviation present.  Cardiovascular: Normal rate and regular rhythm.   Pulmonary/Chest: Effort normal. No respiratory distress.  Abdominal: Soft. She exhibits no distension.  Lymphadenopathy:    She has cervical adenopathy.       Right cervical: Superficial cervical adenopathy present.       Left cervical: Superficial cervical adenopathy present.  Neurological: She is alert and oriented to person, place, and time.  Skin: Skin is warm and dry.  Psychiatric: She has a normal mood and affect.  Vitals reviewed.   ED Course  Procedures (including critical care time) Labs Review Labs Reviewed  TSH  T4, FREE    Imaging Review No results found. I have personally reviewed and evaluated these images and lab results as part of my medical decision-making.   EKG Interpretation None      MDM   Final diagnoses:  Acute viral pharyngitis   50 y.o. female presents with concern that her thyroid has been increasing in size because she has had sore throat for the last 2 days and some fullness in her throat worse with lying flat at night. She has had cough for about the last week. She has had sick contacts in her 2 grandchildren who have upper respiratory infections. She appears to have a pharyngitis on exam and tenderness is over her anterior cervical lymph nodes and not her thyroid, no nodularity is  noted. Modified Centor score of 1, no further testing indicated for strep. At this time I suspect she has a mild viral pharyngitis and will be treated empirically with Decadron to help with symptoms. Recommended over-the-counter remedies for sore throat. Thyroid screening was performed for reassurance. TSH within normal limits, no further testing indicated currently. Patient had primary care previously and states she will reach back out to re-establish. Plan to follow up with PCP as needed and return precautions discussed for worsening or new concerning symptoms.     Leo Grosser, MD 04/05/15 [phone]

## 2015-04-05 NOTE — ED Notes (Signed)
Pt with thyroid swelling x 2-3 days.  Noticeable on triage.  HX of same.  MD has stated in past not to take out but pt states swelling has worsened and difficulty swallowing, painful

## 2015-04-05 NOTE — Discharge Instructions (Signed)

## 2015-07-24 ENCOUNTER — Ambulatory Visit: Payer: 59 | Admitting: Internal Medicine

## 2015-07-24 DIAGNOSIS — Z0289 Encounter for other administrative examinations: Secondary | ICD-10-CM

## 2015-08-13 ENCOUNTER — Other Ambulatory Visit: Payer: Self-pay | Admitting: Family Medicine

## 2015-08-13 DIAGNOSIS — Z1231 Encounter for screening mammogram for malignant neoplasm of breast: Secondary | ICD-10-CM

## 2015-08-20 ENCOUNTER — Ambulatory Visit
Admission: RE | Admit: 2015-08-20 | Discharge: 2015-08-20 | Disposition: A | Discharge status: Home / Self Care | Payer: 59 | Source: Ambulatory Visit | Attending: Family Medicine | Admitting: Family Medicine

## 2015-08-20 DIAGNOSIS — Z1231 Encounter for screening mammogram for malignant neoplasm of breast: Secondary | ICD-10-CM

## 2015-11-11 ENCOUNTER — Encounter: Payer: Self-pay | Admitting: *Deleted

## 2016-04-14 ENCOUNTER — Emergency Department (HOSPITAL_COMMUNITY)
Admission: EM | Admit: 2016-04-14 | Discharge: 2016-04-15 | Disposition: A | Discharge status: Home / Self Care | Payer: 59 | Attending: Emergency Medicine | Admitting: Emergency Medicine

## 2016-04-14 ENCOUNTER — Encounter (HOSPITAL_COMMUNITY): Payer: Self-pay

## 2016-04-14 DIAGNOSIS — Z8673 Personal history of transient ischemic attack (TIA), and cerebral infarction without residual deficits: Secondary | ICD-10-CM | POA: Diagnosis not present

## 2016-04-14 DIAGNOSIS — R319 Hematuria, unspecified: Secondary | ICD-10-CM

## 2016-04-14 DIAGNOSIS — J45909 Unspecified asthma, uncomplicated: Secondary | ICD-10-CM | POA: Diagnosis not present

## 2016-04-14 DIAGNOSIS — R1012 Left upper quadrant pain: Secondary | ICD-10-CM

## 2016-04-14 DIAGNOSIS — N390 Urinary tract infection, site not specified: Secondary | ICD-10-CM

## 2016-04-14 DIAGNOSIS — N12 Tubulo-interstitial nephritis, not specified as acute or chronic: Secondary | ICD-10-CM | POA: Diagnosis not present

## 2016-04-14 LAB — CBC
HEMATOCRIT: 37.7 % (ref 36.0–46.0)
Hemoglobin: 12.3 g/dL (ref 12.0–15.0)
MCH: 29.6 pg (ref 26.0–34.0)
MCHC: 32.6 g/dL (ref 30.0–36.0)
MCV: 90.8 fL (ref 78.0–100.0)
PLATELETS: 260 10*3/uL (ref 150–400)
RBC: 4.15 MIL/uL (ref 3.87–5.11)
RDW: 12.2 % (ref 11.5–15.5)
WBC: 7.5 10*3/uL (ref 4.0–10.5)

## 2016-04-14 LAB — COMPREHENSIVE METABOLIC PANEL
ALBUMIN: 3.9 g/dL (ref 3.5–5.0)
ALT: 10 U/L — AB (ref 14–54)
AST: 15 U/L (ref 15–41)
Alkaline Phosphatase: 88 U/L (ref 38–126)
Anion gap: 7 (ref 5–15)
BUN: 7 mg/dL (ref 6–20)
CHLORIDE: 105 mmol/L (ref 101–111)
CO2: 26 mmol/L (ref 22–32)
CREATININE: 0.87 mg/dL (ref 0.44–1.00)
Calcium: 9.3 mg/dL (ref 8.9–10.3)
GFR calc Af Amer: >60 mL/min (ref >60)
GFR calc non Af Amer: >60 mL/min (ref >60)
Glucose, Bld: 100 mg/dL — ABNORMAL HIGH (ref 65–99)
POTASSIUM: 3.5 mmol/L (ref 3.5–5.1)
SODIUM: 138 mmol/L (ref 135–145)
Total Bilirubin: 0.5 mg/dL (ref 0.3–1.2)
Total Protein: 7.1 g/dL (ref 6.5–8.1)

## 2016-04-14 LAB — URINALYSIS, ROUTINE W REFLEX MICROSCOPIC
Bilirubin Urine: NEGATIVE
Glucose, UA: NEGATIVE mg/dL
Ketones, ur: NEGATIVE mg/dL
Nitrite: NEGATIVE
PROTEIN: 100 mg/dL — AB
SPECIFIC GRAVITY, URINE: 1.009 (ref 1.005–1.030)
pH: 5 (ref 5.0–8.0)

## 2016-04-14 LAB — LIPASE, BLOOD: LIPASE: 20 U/L (ref 11–51)

## 2016-04-14 MED ORDER — KETOROLAC TROMETHAMINE 30 MG/ML IJ SOLN
15.0000 mg | Freq: Once | INTRAMUSCULAR | Status: AC
  Administered 2016-04-15: 15 mg via INTRAVENOUS
  Filled 2016-04-14: qty 1

## 2016-04-14 MED ORDER — DEXTROSE 5 % IV SOLN
1.0000 g | Freq: Once | INTRAVENOUS | Status: AC
  Administered 2016-04-15: 1 g via INTRAVENOUS
  Filled 2016-04-14: qty 10

## 2016-04-14 NOTE — ED Triage Notes (Signed)
PT C/O LLQ PAIN WITH HEMATURIA X1 WEEK. PT STS SHE STARTED HAVING URGENCY ABOUT A YEAR AGO, AND HER PCP SAID IT WAS DUE TO HER GETTING OLDER. PT STS THEN LAST WEEK THE BLOOD AND PAIN STARTED. PT WAS SEEN AT [facility] TODAY AND WAS TOLD TO COME HERE FOR FURTHER EVALUATION. DENIES FEVER.

## 2016-04-14 NOTE — ED Provider Notes (Signed)
[facility] DEPT Provider Note   CSN: [id number] Arrival date & time: 04/14/16  1921  By signing my name below, I, Susan Berry, attest that this documentation has been prepared under the direction and in the presence of [address] [employer]. Electronically Signed: Reola Berry, ED Scribe. 04/14/16. 11:45 PM.  History   Chief Complaint Chief Complaint  Patient presents with  . Abdominal Pain    LLQ  . Hematuria   The history is provided by the patient and medical records. No language interpreter was used.  Abdominal Pain   This is a new problem. The current episode started 6 to 12 hours ago. The problem occurs constantly. The problem has not changed since onset.The pain is associated with an unknown factor. The pain is located in the LUQ. The quality of the pain is dull. The pain is at a severity of 8/10. The pain is moderate. Associated symptoms include frequency and hematuria. Pertinent negatives include fever, diarrhea, flatus, hematochezia, melena, nausea, vomiting, constipation, dysuria, arthralgias and myalgias. The symptoms are aggravated by palpation. The symptoms are relieved by certain positions.    HPI Comments: Susan Berry is a 51 y.o. female with a PMHx of asthma, anemia, HLD, and prior TIA, who presents to the Emergency Department complaining of intermittent gradually worsening episodes of hematuria beginning one week ago. Pt reports associated LUQ abdominal pain beginning today after being seen at [facility] today after they were palpating her abdomen, for which she was sent here. She describes her pain as 8/10 dull constant LUQ pain radiating to L flank, alleviated with laying on her left side, and worsened with palpation over the LUQ. She also notes associated urinary urgency, frequency, and "weird" sensation to her bladder following urination which she is unable to describe further (denies that it's painful or a sensation of incomplete bladder emptying, just  states it feels "weird"). Pt was seen at [facility] prior to her arrival where a UA was performed which was remarkable for blood in her urine. No h/o renal calculi. No sick contacts with similar symptoms. No recent travel or suspicious food intake. No frequent alcohol or NSAID usage. She denies fevers, chills, CP, SOB, N/V/D/C, urinary hesitancy, dysuria, melena, hematochezia, dysuria, vaginal bleeding/discharge, myalgias, arthralgias, numbness, tingling, focal weakness, or any other complaints at this time.  Past Medical History:  Diagnosis Date  . Asthma    last use of inhaler 5 yrs ago  . Enlarged thyroid gland    left side - f/u with md yearly  . SVD (spontaneous vaginal delivery)    x 4  . TIA (transient ischemic attack)    Patient Active Problem List   Diagnosis Date Noted  . THYROID MASS 02/26/2010  . LEG EDEMA, BILATERAL 09/04/2009  . ANEMIA, MILD, HX OF 09/04/2009  . PELVIC PAIN, LEFT 05/25/2008  . AMENORRHEA 04/14/2008  . BACK PAIN 04/14/2008  . OTHER ANXIETY STATES 11/18/2007  . CHEST PAIN 11/18/2007  . HYPERLIPIDEMIA 08/09/2007  . SOB 08/09/2007   Past Surgical History:  Procedure Laterality Date  . ABLATION    . BREAST CYST EXCISION     left  . EYE SURGERY     left eye surgery  . HYSTEROSCOPY W/D&C N/A 11/24/2012   Procedure: DILATATION AND CURETTAGE /HYSTEROSCOPY;  Surgeon: Frederico Hamman, MD;  Location: [facility];  Service: Gynecology;  Laterality: N/A;  . repair of vaginal tear   1986   after SVD  . TUBAL LIGATION     OB History  Gravida Para Term Preterm AB Living   4 4       4    SAB TAB Ectopic Multiple Live Births                 Home Medications    Prior to Admission medications   Not on File   Family History History reviewed. No pertinent family history.  Social History Social History  Substance Use Topics  . Smoking status: Never Smoker  . Smokeless tobacco: Never Used  . Alcohol use No   Allergies   Aspirin and Nsaids  Review of  Systems Review of Systems  Constitutional: Negative for chills and fever.  Respiratory: Negative for shortness of breath.   Cardiovascular: Negative for chest pain.  Gastrointestinal: Positive for abdominal pain. Negative for blood in stool, constipation, diarrhea, flatus, hematochezia, melena, nausea and vomiting.  Genitourinary: Positive for flank pain, frequency, hematuria and urgency. Negative for dysuria, vaginal bleeding and vaginal discharge.  Musculoskeletal: Negative for arthralgias and myalgias.  Skin: Negative for color change.  Allergic/Immunologic: Negative for immunocompromised state.  Neurological: Negative for weakness and numbness.  Psychiatric/Behavioral: Negative for confusion.   A complete 10 system review of systems was obtained and all systems are negative except as noted in the HPI and PMH.   Physical Exam Updated Vital Signs BP 137/93 (BP Location: Left Arm)   Pulse 73   Temp 98.9 F (37.2 C) (Oral)   Resp 18   Ht 5\' 8"  (1.727 m)   Wt 182 lb (82.6 kg)   SpO2 99%   BMI 27.67 kg/m   Physical Exam  Constitutional: She is oriented to person, place, and time. Vital signs are normal. She appears well-developed and well-nourished.  Non-toxic appearance. No distress.  Afebrile, nontoxic, NAD  HENT:  Head: Normocephalic and atraumatic.  Mouth/Throat: Oropharynx is clear and moist and mucous membranes are normal.  Eyes: Conjunctivae and EOM are normal. Right eye exhibits no discharge. Left eye exhibits no discharge.  Neck: Normal range of motion. Neck supple.  Cardiovascular: Normal rate, regular rhythm, normal heart sounds and intact distal pulses.  Exam reveals no gallop and no friction rub.   No murmur heard. Pulmonary/Chest: Effort normal and breath sounds normal. No respiratory distress. She has no decreased breath sounds. She has no wheezes. She has no rhonchi. She has no rales.  Abdominal: Soft. Normal appearance and bowel sounds are normal. She exhibits no  distension. There is tenderness in the left upper quadrant. There is CVA tenderness (left side). There is no rigidity, no rebound, no guarding, no tenderness at McBurney's point and negative Murphy's sign.  Soft, non-distended, +BS throughout, with mild LUQ TTP tracking towards the left flank, no r/g/r, neg murphy's, neg mcburney's, +left sided CVA TTP  Musculoskeletal: Normal range of motion.  Neurological: She is alert and oriented to person, place, and time. She has normal strength. No sensory deficit.  Skin: Skin is warm, dry and intact. No rash noted.  Psychiatric: She has a normal mood and affect.  Nursing note and vitals reviewed.  ED Treatments / Results  DIAGNOSTIC STUDIES: Oxygen Saturation is 99% on RA, normal by my interpretation.   COORDINATION OF CARE: 11:45 PM-Discussed next steps with pt including [place], lab work, and pain control with Toradol and Rocephin. Pt verbalized understanding and is agreeable with the plan.   Labs (all labs ordered are listed, but only abnormal results are displayed) Labs Reviewed  COMPREHENSIVE METABOLIC PANEL - Abnormal; Notable for the following:  Result Value   Glucose, Bld 100 (*)    ALT 10 (*)    All other components within normal limits  URINALYSIS, ROUTINE W REFLEX MICROSCOPIC - Abnormal; Notable for the following:    APPearance CLOUDY (*)    Hgb urine dipstick MODERATE (*)    Protein, ur 100 (*)    Leukocytes, UA LARGE (*)    Bacteria, UA MANY (*)    Squamous Epithelial / LPF 0-5 (*)    All other components within normal limits  LIPASE, BLOOD  CBC   EKG  EKG Interpretation None      Radiology US Renal  Result Date: 04/15/2016 CLINICAL DATA:  Acute onset of left flank and left lower quadrant abdominal pain. Initial encounter. EXAM: RENAL / URINARY TRACT ULTRASOUND COMPLETE COMPARISON:  None. FINDINGS: Right Kidney: Length: 9.6 cm. Echogenicity within normal limits. A small 1.2 cm cyst is noted at the upper pole of the  right kidney. No hydronephrosis visualized. Left Kidney: Length: 10.0 cm. Difficult to fully characterize given overlying soft tissues. Echogenicity within normal limits. No mass or hydronephrosis visualized. Bladder: The bladder is partially distended, with bilateral ureteral jets visualized. Mild bladder wall thickening is suggested, raising question for inflammation and cystitis. IMPRESSION: 1. No evidence of hydronephrosis. 2. Small right renal cysts. 3. Mild bladder wall thickening suggested, raising question for inflammation and cystitis. Would correlate with the patient's symptoms. Electronically Signed   By: Garald Balding M.D.   On: 04/15/2016 00:40    Procedures Procedures   Medications Ordered in ED Medications  cefTRIAXone (ROCEPHIN) 1 g in dextrose 5 % 50 mL IVPB (1 g Intravenous New Bag/Given 04/15/16 0014)  ketorolac (TORADOL) 30 MG/ML injection 15 mg (15 mg Intravenous Given 04/15/16 0015)   Initial Impression / Assessment and Plan / ED Course  I have reviewed the triage vital signs and the nursing notes.  Pertinent labs & imaging results that were available during my care of the patient were reviewed by me and considered in my medical decision making (see chart for details).     51 y.o. female here with hematuria and urinary urgency/frequency x1wk, and LUQ/L flank pain that began today while at [facility]. Was sent here for evaluation of this. On exam, mild LUQ TTP, mild L sided flank TTP, nonperitoneal exam otherwise. Lipase, CBC, and CMP WNL. U/A with 0-5 squamous, many bacteria, TNTC WBC and RBCs, large leuks, consistent with UTI. I suspect she has developing pyelonephritis, although no leukocytosis is reassuring. Doubt kidney stone given lack of n/v, however this is still possible given her symptoms. Will get renal U/S to r/o stone, and to eval for pyelonephritis. Will give toradol and rocephin then reassess shortly.   2:49 AM Pt feeling better after toradol. U/S with small right  renal cyst, no hydronephrosis or evidence of stones on either side, with mild bladder wall thickening suspicious for cystitis which is consistent with our lab findings. Will proceed with tx for UTI/early pyelonephritis, using bactrim x10 days. Advised adequate hydration, tylenol/motrin for pain, and PCP f/up in 1wk for recheck. I explained the diagnosis and have given explicit precautions to return to the ER including for any other new or worsening symptoms. The patient understands and accepts the medical plan as it's been dictated and I have answered their questions. Discharge instructions concerning home care and prescriptions have been given. The patient is STABLE and is discharged to home in good condition.   I personally performed the services described in this documentation,  which was scribed in my presence. The recorded information has been reviewed and is accurate.   Final Clinical Impressions(s) / ED Diagnoses   Final diagnoses:  Lower urinary tract infectious disease  Hematuria, unspecified type  Left upper quadrant pain  Pyelonephritis   New Prescriptions New Prescriptions   SULFAMETHOXAZOLE-TRIMETHOPRIM (BACTRIM DS,SEPTRA DS) 800-160 MG TABLET    Take 1 tablet by mouth 2 (two) times daily.     [address] PA-C 04/15/16 [place], MD 04/15/16 [phone]

## 2016-04-15 ENCOUNTER — Emergency Department (HOSPITAL_COMMUNITY): Payer: 59

## 2016-04-15 MED ORDER — SULFAMETHOXAZOLE-TRIMETHOPRIM 800-160 MG PO TABS: 1.0000 | ORAL_TABLET | Freq: Two times a day (BID) | ORAL | 0 refills | Status: AC

## 2016-04-15 NOTE — Discharge Instructions (Signed)
Stay very well hydrated with plenty of water throughout the day. Take antibiotic until completed. Alternate between tylenol and motrin as needed for pain. Follow up with your primary care physician in 1 week for recheck of ongoing symptoms but return to ER for emergent changing or worsening of symptoms. Please seek immediate care if you develop the following: You develop back pain.  Your symptoms are no better, or worse in 3 days. There is severe back pain or lower abdominal pain.  You develop chills.  You have a fever.  There is nausea or vomiting.  There is continued burning or discomfort with urination.

## 2016-04-15 NOTE — ED Notes (Signed)
Patient was alert, oriented and stable upon discharge. RN went over AVS and patient had no further questions.  

## 2017-05-07 ENCOUNTER — Other Ambulatory Visit: Payer: Self-pay | Admitting: Physician Assistant

## 2017-05-07 DIAGNOSIS — Z1231 Encounter for screening mammogram for malignant neoplasm of breast: Secondary | ICD-10-CM

## 2017-06-05 ENCOUNTER — Ambulatory Visit
Admission: RE | Admit: 2017-06-05 | Discharge: 2017-06-05 | Disposition: A | Discharge status: Home / Self Care | Payer: 59 | Source: Ambulatory Visit | Attending: Physician Assistant | Admitting: Physician Assistant

## 2017-06-05 DIAGNOSIS — Z1231 Encounter for screening mammogram for malignant neoplasm of breast: Secondary | ICD-10-CM

## 2017-06-09 ENCOUNTER — Other Ambulatory Visit: Payer: Self-pay | Admitting: Physician Assistant

## 2017-06-09 DIAGNOSIS — R928 Other abnormal and inconclusive findings on diagnostic imaging of breast: Secondary | ICD-10-CM

## 2017-06-19 ENCOUNTER — Other Ambulatory Visit: Payer: 59

## 2017-06-19 ENCOUNTER — Inpatient Hospital Stay: Admission: RE | Admit: 2017-06-19 | Payer: 59 | Source: Ambulatory Visit

## 2017-06-24 ENCOUNTER — Ambulatory Visit
Admission: RE | Admit: 2017-06-24 | Discharge: 2017-06-24 | Disposition: A | Discharge status: Home / Self Care | Payer: 59 | Source: Ambulatory Visit | Attending: Physician Assistant | Admitting: Physician Assistant

## 2017-06-24 DIAGNOSIS — R928 Other abnormal and inconclusive findings on diagnostic imaging of breast: Secondary | ICD-10-CM

## 2017-06-25 ENCOUNTER — Other Ambulatory Visit: Payer: 59

## 2018-07-19 MED FILL — RESTASIS 0.05% EYE EMULSION: 0.05 | 30 days supply | Qty: 60 | Fill #0

## 2018-08-22 ENCOUNTER — Encounter (HOSPITAL_COMMUNITY): Payer: Self-pay

## 2018-08-22 ENCOUNTER — Emergency Department (HOSPITAL_COMMUNITY)
Admission: EM | Admit: 2018-08-22 | Discharge: 2018-08-23 | Disposition: A | Discharge status: Home / Self Care | Payer: 59 | Attending: Emergency Medicine | Admitting: Emergency Medicine

## 2018-08-22 ENCOUNTER — Other Ambulatory Visit: Payer: Self-pay

## 2018-08-22 DIAGNOSIS — Z8673 Personal history of transient ischemic attack (TIA), and cerebral infarction without residual deficits: Secondary | ICD-10-CM | POA: Diagnosis not present

## 2018-08-22 DIAGNOSIS — J45909 Unspecified asthma, uncomplicated: Secondary | ICD-10-CM | POA: Insufficient documentation

## 2018-08-22 DIAGNOSIS — Y999 Unspecified external cause status: Secondary | ICD-10-CM | POA: Insufficient documentation

## 2018-08-22 DIAGNOSIS — W57XXXA Bitten or stung by nonvenomous insect and other nonvenomous arthropods, initial encounter: Secondary | ICD-10-CM | POA: Diagnosis not present

## 2018-08-22 DIAGNOSIS — S70361A Insect bite (nonvenomous), right thigh, initial encounter: Secondary | ICD-10-CM | POA: Diagnosis not present

## 2018-08-22 DIAGNOSIS — Y939 Activity, unspecified: Secondary | ICD-10-CM | POA: Insufficient documentation

## 2018-08-22 DIAGNOSIS — Y929 Unspecified place or not applicable: Secondary | ICD-10-CM | POA: Diagnosis not present

## 2018-08-22 NOTE — ED Triage Notes (Signed)
Pt reports that she was bitten yesterday by a bug. No know info about the bug. Area is firm to the touch with slight swelling. Painful to walk.

## 2018-08-23 MED ORDER — CEPHALEXIN 500 MG PO CAPS: 500.0000 mg | ORAL_CAPSULE | Freq: Two times a day (BID) | ORAL | 0 refills | Status: AC

## 2018-08-23 NOTE — ED Provider Notes (Signed)
[facility] EMERGENCY DEPARTMENT Provider Note   CSN: [id number] Arrival date & time: 08/22/18  2138    History   Chief Complaint Chief Complaint  Patient presents with  . Insect Bite    HPI Susan Berry is a 53 y.o. female with PMH/o Asthma who presents for evaluation of redness and swelling noted to the posterior aspect of her upper right leg that began yesterday.  She states that yesterday while outside at her son's house, she was bitten by a bug.  Unsure of what the bug was.  She states since then, she has had some pain, redness, swelling to the insect bite on the posterior aspect of the leg.  She has not taken any medications.  She states she still able to ambulate but does report some worsening pain with doing so.  She has not noted any fevers, numbness/weakness.  She has no known allergies to bugs.     The history is provided by the patient.    Past Medical History:  Diagnosis Date  . Asthma    last use of inhaler 5 yrs ago  . Enlarged thyroid gland    left side - f/u with md yearly  . SVD (spontaneous vaginal delivery)    x 4  . TIA (transient ischemic attack)     Patient Active Problem List   Diagnosis Date Noted  . THYROID MASS 02/26/2010  . LEG EDEMA, BILATERAL 09/04/2009  . ANEMIA, MILD, HX OF 09/04/2009  . PELVIC PAIN, LEFT 05/25/2008  . AMENORRHEA 04/14/2008  . BACK PAIN 04/14/2008  . OTHER ANXIETY STATES 11/18/2007  . CHEST PAIN 11/18/2007  . HYPERLIPIDEMIA 08/09/2007  . SOB 08/09/2007    Past Surgical History:  Procedure Laterality Date  . ABLATION    . BREAST CYST EXCISION Left 30 yrs ago   left  . EYE SURGERY     left eye surgery  . HYSTEROSCOPY W/D&C N/A 11/24/2012   Procedure: DILATATION AND CURETTAGE /HYSTEROSCOPY;  Surgeon: Frederico Hamman, MD;  Location: [facility];  Service: Gynecology;  Laterality: N/A;  . repair of vaginal tear   1986   after SVD  . TUBAL LIGATION       OB History    Gravida  4   Para  4   Term      Preterm      AB      Living  4     SAB      TAB      Ectopic      Multiple      Live Births               Home Medications    Prior to Admission medications   Medication Sig Start Date End Date Taking? Authorizing Provider  cephALEXin (KEFLEX) 500 MG capsule Take 1 capsule (500 mg total) by mouth 2 (two) times daily for 7 days. 08/23/18 08/30/18  Volanda Napoleon, PA-C    Family History History reviewed. No pertinent family history.  Social History Social History   Tobacco Use  . Smoking status: Never Smoker  . Smokeless tobacco: Never Used  Substance Use Topics  . Alcohol use: No  . Drug use: No     Allergies   Aspirin and Nsaids   Review of Systems Review of Systems  Constitutional: Negative for fever.  Skin: Positive for color change and wound.  Neurological: Negative for weakness and numbness.  All other systems reviewed and are negative.  Physical Exam Updated Vital Signs BP 109/76   Pulse 76   Temp 99.1 F (37.3 C) (Oral)   Resp 18   SpO2 98%   Physical Exam Vitals signs and nursing note reviewed.  Constitutional:      Appearance: She is well-developed.  HENT:     Head: Normocephalic and atraumatic.  Eyes:     General: No scleral icterus.       Right eye: No discharge.        Left eye: No discharge.     Conjunctiva/sclera: Conjunctivae normal.  Cardiovascular:     Pulses:          Dorsalis pedis pulses are 2+ on the right side and 2+ on the left side.  Pulmonary:     Effort: Pulmonary effort is normal.  Skin:    General: Skin is warm and dry.     Capillary Refill: Capillary refill takes less than 2 seconds.          Comments: Good distal cap refill.  RLE is not dusky in appearance or cool to touch. No streaking.   Neurological:     Mental Status: She is alert.  Psychiatric:        Speech: Speech normal.        Behavior: Behavior normal.      ED Treatments / Results  Labs (all labs ordered are listed,  but only abnormal results are displayed) Labs Reviewed - No data to display  EKG None  Radiology No results found.  Procedures Procedures (including critical care time)  Medications Ordered in ED Medications - No data to display   Initial Impression / Assessment and Plan / ED Course  I have reviewed the triage vital signs and the nursing notes.  Pertinent labs & imaging results that were available during my care of the patient were reviewed by me and considered in my medical decision making (see chart for details).        53 year old female who presents for evaluation of possible bug bite to right lower leg that occurred yesterday.  Reports she was outside all day and felt something bit her but unsure of what it is.  Since then has had pain, redness, swelling to the area.  She has not taken any medications. No fevers. Patient is afebrile, non-toxic appearing, sitting comfortably on examination table. Vital signs reviewed and stable.  On exam, she has a 5 x 4 cm area of erythema, warmth, induration of the right posterior thigh.  Suspect this is localized reaction from insect bite.  No identifiable abscess noted to the area. History/physical exam not concerning for DVT, abscess.   No systemic signs of illness.  She has no streaking noted.  Area marked with a skin marker.  Patient encouraged on at home supportive care measures. Patient with no known drug allergies. Given warmth, erythema and induration I discussed with patient regarding antibiotics. Will plan to send her home with abx and discussed that she could wait 24 hours and start them if symptoms get worse. At this time, patient exhibits no emergent life-threatening condition that require further evaluation in ED or admission. Patient had ample opportunity for questions and discussion. All patient's questions were answered with full understanding. Strict return precautions discussed. Patient expresses understanding and agreement to plan.    Portions of this note were generated with [employer]. Dictation errors may occur despite best attempts at proofreading.   Final Clinical Impressions(s) / ED Diagnoses   Final diagnoses:  Insect bite of right thigh, initial encounter    ED Discharge Orders         Ordered    cephALEXin (KEFLEX) 500 MG capsule  2 times daily     08/23/18 0013           Volanda Napoleon, PA-C 08/23/18 0502    Fatima Blank, MD 08/23/18 [phone]

## 2018-08-23 NOTE — ED Notes (Signed)
Patient is A&Ox4.  No signs of distress noted.  Please see providers complete history and physical exam.  

## 2018-08-23 NOTE — Discharge Instructions (Signed)
You can take Tylenol or Ibuprofen as directed for pain. You can alternate Tylenol and Ibuprofen every 4 hours. If you take Tylenol at 1pm, then you can take Ibuprofen at 5pm. Then you can take Tylenol again at 9pm.   Apply ice  You can take Benadryl to help with symptoms.  As we discussed, closely monitor the area.  If it starts extending up the line or gets worse, you can start the antibiotics.  If you start experiencing fever, streaking that starts spreading down your leg, nausea/vomiting or any other worsening or concerning symptoms, return emergency department immediately.

## 2018-08-23 NOTE — ED Notes (Addendum)
PT states understanding of care given, follow up care, and medication prescribed. PT ambulated from ED to car with a steady gait. 

## 2018-10-14 ENCOUNTER — Other Ambulatory Visit: Payer: Self-pay

## 2018-10-14 DIAGNOSIS — Z20822 Contact with and (suspected) exposure to covid-19: Secondary | ICD-10-CM

## 2018-10-16 LAB — NOVEL CORONAVIRUS, NAA: SARS-CoV-2, NAA: NOT DETECTED

## 2018-11-02 ENCOUNTER — Other Ambulatory Visit: Payer: Self-pay | Admitting: Family Medicine

## 2018-11-02 DIAGNOSIS — Z1231 Encounter for screening mammogram for malignant neoplasm of breast: Secondary | ICD-10-CM

## 2019-01-03 ENCOUNTER — Other Ambulatory Visit: Payer: Self-pay

## 2019-01-03 ENCOUNTER — Emergency Department (HOSPITAL_COMMUNITY): Payer: 59

## 2019-01-03 ENCOUNTER — Emergency Department (HOSPITAL_COMMUNITY)
Admission: EM | Admit: 2019-01-03 | Discharge: 2019-01-03 | Disposition: A | Discharge status: Home / Self Care | Payer: 59 | Attending: Emergency Medicine | Admitting: Emergency Medicine

## 2019-01-03 DIAGNOSIS — K2900 Acute gastritis without bleeding: Secondary | ICD-10-CM | POA: Insufficient documentation

## 2019-01-03 DIAGNOSIS — J45909 Unspecified asthma, uncomplicated: Secondary | ICD-10-CM | POA: Insufficient documentation

## 2019-01-03 DIAGNOSIS — Z79899 Other long term (current) drug therapy: Secondary | ICD-10-CM | POA: Diagnosis not present

## 2019-01-03 DIAGNOSIS — R109 Unspecified abdominal pain: Secondary | ICD-10-CM | POA: Diagnosis present

## 2019-01-03 LAB — CBC WITH DIFFERENTIAL/PLATELET
Abs Immature Granulocytes: 0.06 10*3/uL (ref 0.00–0.07)
Basophils Absolute: 0 10*3/uL (ref 0.0–0.1)
Basophils Relative: 0 %
Eosinophils Absolute: 0.1 10*3/uL (ref 0.0–0.5)
Eosinophils Relative: 2 %
HCT: 44.7 % (ref 36.0–46.0)
Hemoglobin: 13.8 g/dL (ref 12.0–15.0)
Immature Granulocytes: 1 %
Lymphocytes Relative: 44 %
Lymphs Abs: 2.6 10*3/uL (ref 0.7–4.0)
MCH: 29.7 pg (ref 26.0–34.0)
MCHC: 30.9 g/dL (ref 30.0–36.0)
MCV: 96.1 fL (ref 80.0–100.0)
Monocytes Absolute: 0.3 10*3/uL (ref 0.1–1.0)
Monocytes Relative: 5 %
Neutro Abs: 2.8 10*3/uL (ref 1.7–7.7)
Neutrophils Relative %: 48 %
Platelets: 251 10*3/uL (ref 150–400)
RBC: 4.65 MIL/uL (ref 3.87–5.11)
RDW: 12 % (ref 11.5–15.5)
WBC: 5.9 10*3/uL (ref 4.0–10.5)
nRBC: 0 % (ref 0.0–0.2)

## 2019-01-03 LAB — URINALYSIS, ROUTINE W REFLEX MICROSCOPIC
Bilirubin Urine: NEGATIVE
Glucose, UA: NEGATIVE mg/dL
Hgb urine dipstick: NEGATIVE
Ketones, ur: NEGATIVE mg/dL
Leukocytes,Ua: NEGATIVE
Nitrite: NEGATIVE
Protein, ur: NEGATIVE mg/dL
Specific Gravity, Urine: 1.01 (ref 1.005–1.030)
pH: 6 (ref 5.0–8.0)

## 2019-01-03 LAB — COMPREHENSIVE METABOLIC PANEL
ALT: 12 U/L (ref 0–44)
AST: 16 U/L (ref 15–41)
Albumin: 3.7 g/dL (ref 3.5–5.0)
Alkaline Phosphatase: 93 U/L (ref 38–126)
Anion gap: 14 (ref 5–15)
BUN: 7 mg/dL (ref 6–20)
CO2: 24 mmol/L (ref 22–32)
Calcium: 10.3 mg/dL (ref 8.9–10.3)
Chloride: 103 mmol/L (ref 98–111)
Creatinine, Ser: 0.92 mg/dL (ref 0.44–1.00)
GFR calc Af Amer: >60 mL/min (ref >60)
GFR calc non Af Amer: >60 mL/min (ref >60)
Glucose, Bld: 123 mg/dL — ABNORMAL HIGH (ref 70–99)
Potassium: 3.6 mmol/L (ref 3.5–5.1)
Sodium: 141 mmol/L (ref 135–145)
Total Bilirubin: 0.8 mg/dL (ref 0.3–1.2)
Total Protein: 7.2 g/dL (ref 6.5–8.1)

## 2019-01-03 LAB — LIPASE, BLOOD: Lipase: 24 U/L (ref 11–51)

## 2019-01-03 LAB — LACTIC ACID, PLASMA: Lactic Acid, Venous: 1.8 mmol/L (ref 0.5–1.9)

## 2019-01-03 MED ORDER — FENTANYL CITRATE (PF) 100 MCG/2ML IJ SOLN
25.0000 ug | Freq: Once | INTRAMUSCULAR | Status: AC
  Administered 2019-01-03: 10:00:00 25 ug via INTRAVENOUS
  Filled 2019-01-03: qty 2

## 2019-01-03 MED ORDER — ONDANSETRON 4 MG PO TBDP: 4.0000 mg | ORAL_TABLET | Freq: Three times a day (TID) | ORAL | 0 refills | Status: DC | PRN

## 2019-01-03 MED ORDER — OXYCODONE-ACETAMINOPHEN 5-325 MG PO TABS
2.0000 | ORAL_TABLET | ORAL | Status: DC | PRN
  Administered 2019-01-03: 09:00:00 2 via ORAL
  Filled 2019-01-03: qty 2

## 2019-01-03 MED ORDER — ONDANSETRON 4 MG PO TBDP
4.0000 mg | ORAL_TABLET | Freq: Once | ORAL | Status: AC
  Administered 2019-01-03: 4 mg via ORAL
  Filled 2019-01-03: qty 1

## 2019-01-03 MED ORDER — ALUMINUM-MAGNESIUM-SIMETHICONE 200-200-20 MG/5ML PO SUSP: 30.0000 mL | Freq: Three times a day (TID) | ORAL | 0 refills | Status: DC

## 2019-01-03 MED ORDER — FAMOTIDINE IN NACL 20-0.9 MG/50ML-% IV SOLN
20.0000 mg | Freq: Once | INTRAVENOUS | Status: AC
  Administered 2019-01-03: 10:00:00 20 mg via INTRAVENOUS
  Filled 2019-01-03: qty 50

## 2019-01-03 MED ORDER — SODIUM CHLORIDE 0.9 % IV BOLUS
1000.0000 mL | Freq: Once | INTRAVENOUS | Status: AC
  Administered 2019-01-03: 1000 mL via INTRAVENOUS

## 2019-01-03 NOTE — ED Triage Notes (Signed)
Pt endorses left sided abd pain with diarrhea. 9/10 pain and appears to be in obvious pain. Hypertensive and tachy.

## 2019-01-03 NOTE — ED Notes (Signed)
Patient transported to CT 

## 2019-01-03 NOTE — ED Notes (Signed)
Patient Alert and oriented to baseline. Stable and ambulatory to baseline. Patient verbalized understanding of the discharge instructions.  Patient belongings were taken by the patient.   

## 2019-01-03 NOTE — ED Provider Notes (Signed)
[facility] EMERGENCY DEPARTMENT Provider Note   CSN: [id number] Arrival date & time: 01/03/19  [id number]     History   Chief Complaint Chief Complaint  Patient presents with  . Abdominal Pain    HPI HEER MATHUS is a 53 y.o. female with a past medical history of anemia presenting to the ED with a chief plaint of left-sided abdominal pain with associated diarrhea.  Symptoms have been going on since yesterday but worsened this morning.  Reports nausea but denies any vomiting.  Cannot recall any suspicious food ingestions.  Reports history of similar symptoms in the past but unsure why, states that "they told me it was something going on with the muscles in my stomach."  She has not tried any over-the-counter medications to help with her symptoms.  Denies any sick contacts with similar symptoms, urinary symptoms, hematochezia or melena, fever, prior abdominal surgeries, vaginal complaints or back pain, chest pain.     HPI  Past Medical History:  Diagnosis Date  . Asthma    last use of inhaler 5 yrs ago  . Enlarged thyroid gland    left side - f/u with md yearly  . SVD (spontaneous vaginal delivery)    x 4  . TIA (transient ischemic attack)     Patient Active Problem List   Diagnosis Date Noted  . THYROID MASS 02/26/2010  . LEG EDEMA, BILATERAL 09/04/2009  . ANEMIA, MILD, HX OF 09/04/2009  . PELVIC PAIN, LEFT 05/25/2008  . AMENORRHEA 04/14/2008  . BACK PAIN 04/14/2008  . OTHER ANXIETY STATES 11/18/2007  . CHEST PAIN 11/18/2007  . HYPERLIPIDEMIA 08/09/2007  . SOB 08/09/2007    Past Surgical History:  Procedure Laterality Date  . ABLATION    . BREAST CYST EXCISION Left 30 yrs ago   left  . EYE SURGERY     left eye surgery  . HYSTEROSCOPY W/D&C N/A 11/24/2012   Procedure: DILATATION AND CURETTAGE /HYSTEROSCOPY;  Surgeon: Frederico Hamman, MD;  Location: [facility];  Service: Gynecology;  Laterality: N/A;  . repair of vaginal tear   1986   after SVD   . TUBAL LIGATION       OB History    Gravida  4   Para  4   Term      Preterm      AB      Living  4     SAB      TAB      Ectopic      Multiple      Live Births               Home Medications    Prior to Admission medications   Medication Sig Start Date End Date Taking? Authorizing Provider  Ferrous Sulfate (IRON PO) Take 1 tablet by mouth daily.   Yes [provider]  Multiple Vitamins-Minerals (EYE VITAMINS & MINERALS PO) Take 1 tablet by mouth daily.   Yes [provider]  Multiple Vitamins-Minerals (ZINC PO) Take 1 tablet by mouth daily.   Yes [provider]  oxybutynin (DITROPAN-XL) 10 MG 24 hr tablet Take 10 mg by mouth daily. 12/22/18  Yes [provider]  ranitidine (ZANTAC) 150 MG capsule Take 150 mg by mouth 2 (two) times daily.   Yes [provider]  RESTASIS 0.05 % ophthalmic emulsion Place 1 drop into both eyes daily. 07/19/18  Yes [provider]  vitamin C (ASCORBIC ACID) 500 MG tablet Take 500  mg by mouth daily.   Yes [provider]  aluminum-magnesium hydroxide-simethicone (MAALOX) 200-200-20 MG/5ML SUSP Take 30 mLs by mouth 4 (four) times daily -  before meals and at bedtime. 01/03/19   Jujuan Dugo, PA-C  ondansetron (ZOFRAN ODT) 4 MG disintegrating tablet Take 1 tablet (4 mg total) by mouth every 8 (eight) hours as needed for nausea or vomiting. 01/03/19   Delia Heady, PA-C    Family History No family history on file.  Social History Social History   Tobacco Use  . Smoking status: Never Smoker  . Smokeless tobacco: Never Used  Substance Use Topics  . Alcohol use: No  . Drug use: No     Allergies   Aspirin and Nsaids   Review of Systems Review of Systems  Constitutional: Negative for appetite change, chills and fever.  HENT: Negative for ear pain, rhinorrhea, sneezing and sore throat.   Eyes: Negative for photophobia and visual disturbance.  Respiratory: Negative  for cough, chest tightness, shortness of breath and wheezing.   Cardiovascular: Negative for chest pain and palpitations.  Gastrointestinal: Positive for abdominal pain, diarrhea and nausea. Negative for blood in stool, constipation and vomiting.  Genitourinary: Negative for dysuria, hematuria and urgency.  Musculoskeletal: Negative for myalgias.  Skin: Negative for rash.  Neurological: Negative for dizziness, weakness and light-headedness.     Physical Exam Updated Vital Signs BP 100/61   Pulse 60   Temp 97.9 F (36.6 C) (Oral)   Resp 18   SpO2 95%   Physical Exam Vitals signs and nursing note reviewed.  Constitutional:      General: She is not in acute distress.    Appearance: She is well-developed.  HENT:     Head: Normocephalic and atraumatic.     Nose: Nose normal.  Eyes:     General: No scleral icterus.       Left eye: No discharge.     Conjunctiva/sclera: Conjunctivae normal.  Neck:     Musculoskeletal: Normal range of motion and neck supple.  Cardiovascular:     Rate and Rhythm: Normal rate and regular rhythm.     Heart sounds: Normal heart sounds. No murmur. No friction rub. No gallop.   Pulmonary:     Effort: Pulmonary effort is normal. No respiratory distress.     Breath sounds: Normal breath sounds.  Abdominal:     General: Bowel sounds are normal. There is no distension.     Palpations: Abdomen is soft.     Tenderness: There is abdominal tenderness in the left upper quadrant and left lower quadrant. There is no guarding.  Musculoskeletal: Normal range of motion.  Skin:    General: Skin is warm and dry.     Findings: No rash.  Neurological:     Mental Status: She is alert.     Motor: No abnormal muscle tone.     Coordination: Coordination normal.      ED Treatments / Results  Labs (all labs ordered are listed, but only abnormal results are displayed) Labs Reviewed  COMPREHENSIVE METABOLIC PANEL - Abnormal; Notable for the following components:       Result Value   Glucose, Bld 123 (*)    All other components within normal limits  URINALYSIS, ROUTINE W REFLEX MICROSCOPIC - Abnormal; Notable for the following components:   Color, Urine STRAW (*)    All other components within normal limits  LIPASE, BLOOD  LACTIC ACID, PLASMA  CBC WITH DIFFERENTIAL/PLATELET  LACTIC ACID, PLASMA  EKG None  Radiology Ct Renal Stone Study  Result Date: 01/03/2019 CLINICAL DATA:  Flank pain. LEFT-sided flank pain. Concern for obstructive uropathy EXAM: CT ABDOMEN AND PELVIS WITHOUT CONTRAST TECHNIQUE: Multidetector CT imaging of the abdomen and pelvis was performed following the standard protocol without IV contrast. COMPARISON:  None. FINDINGS: Lower chest: Lung bases are clear. Hepatobiliary: No focal hepatic lesion. No biliary duct dilatation. Gallbladder is normal. Common bile duct is normal. Pancreas: Pancreas is normal. No ductal dilatation. No pancreatic inflammation. Spleen: Normal spleen Adrenals/urinary tract: Adrenal glands normal. No nephrolithiasis or ureterolithiasis. Simple fluid attenuation cyst in the RIGHT kidney cortex. No bladder calculi Vascular calcification in the deep LEFT pelvis Stomach/Bowel: Stomach, small bowel, appendix, and cecum are normal. The colon and rectosigmoid colon are normal. Several diverticula descending colon without acute inflammation. Vascular/Lymphatic: Abdominal aorta is normal caliber. No periportal or retroperitoneal adenopathy. No pelvic adenopathy. Reproductive: Uterus and adnexa normal. Tubal ligation clips noted. Other: No free fluid. Musculoskeletal: No aggressive osseous lesion. IMPRESSION: 1. No nephrolithiasis, ureterolithiasis or obstructive uropathy. 2. No diverticulitis. 3. Normal appendix. Electronically Signed   By: Suzy Bouchard M.D.   On: 01/03/2019 11:34    Procedures Procedures (including critical care time)  Medications Ordered in ED Medications  ondansetron (ZOFRAN-ODT)  disintegrating tablet 4 mg (4 mg Oral Given 01/03/19 0924)  sodium chloride 0.9 % bolus 1,000 mL (0 mLs Intravenous Stopped 01/03/19 1055)  fentaNYL (SUBLIMAZE) injection 25 mcg (25 mcg Intravenous Given 01/03/19 1007)  famotidine (PEPCID) IVPB 20 mg premix (0 mg Intravenous Stopped 01/03/19 1037)     Initial Impression / Assessment and Plan / ED Course  I have reviewed the triage vital signs and the nursing notes.  Pertinent labs & imaging results that were available during my care of the patient were reviewed by me and considered in my medical decision making (see chart for details).        53yo F with a past medical history of anemia presents to ED for abdominal pain and diarrhea. L sided abdominal pain since yesterday but worsened this morning.  Reports nausea but denies any vomiting.  Patient has tenderness palpation of the left side of her abdomen without rebound or guarding.  Her vital signs are within normal limits.  Lab work including CBC, lactic acid, CMP, urinalysis are unremarkable.  CT renal stone study is unremarkable.  Patient was given antiemetics, dedication and Pepcid with complete resolution of her symptoms.  Repeat abdominal exams are benign.  Suspect her symptoms are viral in nature.  Will give symptomatic treatment and have follow-up with PCP.  At this time I doubt appendicitis, cholecystitis or other surgical emergent cause of her symptoms.  Patient is hemodynamically stable, in NAD, and able to ambulate in the ED. Evaluation does not show pathology that would require ongoing emergent intervention or inpatient treatment. I explained the diagnosis to the patient. Pain has been managed and has no complaints prior to discharge. Patient is comfortable with above plan and is stable for discharge at this time. All questions were answered prior to disposition. Strict return precautions for returning to the ED were discussed. Encouraged follow up with PCP.   An After Visit Summary  was printed and given to the patient.   Portions of this note were generated with [employer]. Dictation errors may occur despite best attempts at proofreading.   Final Clinical Impressions(s) / ED Diagnoses   Final diagnoses:  Acute gastritis without hemorrhage, unspecified gastritis type  ED Discharge Orders         Ordered    aluminum-magnesium hydroxide-simethicone (MAALOX) 200-200-20 MG/5ML SUSP  3 times daily before meals & bedtime     01/03/19 1235    ondansetron (ZOFRAN ODT) 4 MG disintegrating tablet  Every 8 hours PRN     01/03/19 1235           Delia Heady, PA-C 01/03/19 [place], Ankit, MD 01/04/19 [phone]

## 2019-01-03 NOTE — Discharge Instructions (Signed)
Take the following medications to help with your symptoms. Follow-up with your primary care provider. Return to the ED for worsening symptoms, if you develop a fever, chest pain, shortness of breath

## 2019-01-10 ENCOUNTER — Ambulatory Visit
Admission: RE | Admit: 2019-01-10 | Discharge: 2019-01-10 | Disposition: A | Discharge status: Home / Self Care | Payer: 59 | Source: Ambulatory Visit | Attending: Family Medicine | Admitting: Family Medicine

## 2019-01-10 ENCOUNTER — Other Ambulatory Visit: Payer: Self-pay

## 2019-01-10 DIAGNOSIS — Z1231 Encounter for screening mammogram for malignant neoplasm of breast: Secondary | ICD-10-CM

## 2019-12-16 ENCOUNTER — Ambulatory Visit
Admission: EM | Admit: 2019-12-16 | Discharge: 2019-12-16 | Disposition: A | Discharge status: Home / Self Care | Payer: 59 | Attending: Physician Assistant | Admitting: Physician Assistant

## 2019-12-16 ENCOUNTER — Ambulatory Visit: Payer: 59

## 2019-12-16 ENCOUNTER — Other Ambulatory Visit: Payer: Self-pay

## 2019-12-16 ENCOUNTER — Ambulatory Visit (INDEPENDENT_AMBULATORY_CARE_PROVIDER_SITE_OTHER): Payer: 59

## 2019-12-16 ENCOUNTER — Encounter: Payer: Self-pay | Admitting: Emergency Medicine

## 2019-12-16 DIAGNOSIS — X501XXA Overexertion from prolonged static or awkward postures, initial encounter: Secondary | ICD-10-CM

## 2019-12-16 DIAGNOSIS — M79672 Pain in left foot: Secondary | ICD-10-CM | POA: Diagnosis not present

## 2019-12-16 NOTE — ED Provider Notes (Signed)
[facility]    CSN: [id number] Arrival date & time: 12/16/19  1436      History   Chief Complaint Chief Complaint  Patient presents with  . Ankle Pain    HPI REE ALCALDE is a 54 y.o. female.   54 year old female comes in for continued left ankle/foot pain after injury 5 days ago. Missed a stair and inverted ankle. Had pain and swelling post injury. Now with mild pain at rest, pain with ambulation. Swelling has slightly improved with ice and elevation, but still with pretty significant swelling to the lateral ankle.      Past Medical History:  Diagnosis Date  . Asthma    last use of inhaler 5 yrs ago  . Enlarged thyroid gland    left side - f/u with md yearly  . SVD (spontaneous vaginal delivery)    x 4  . TIA (transient ischemic attack)     Patient Active Problem List   Diagnosis Date Noted  . THYROID MASS 02/26/2010  . LEG EDEMA, BILATERAL 09/04/2009  . ANEMIA, MILD, HX OF 09/04/2009  . PELVIC PAIN, LEFT 05/25/2008  . AMENORRHEA 04/14/2008  . BACK PAIN 04/14/2008  . OTHER ANXIETY STATES 11/18/2007  . CHEST PAIN 11/18/2007  . HYPERLIPIDEMIA 08/09/2007  . SOB 08/09/2007    Past Surgical History:  Procedure Laterality Date  . ABLATION    . BREAST CYST EXCISION Left 30 yrs ago   left  . EYE SURGERY     left eye surgery  . HYSTEROSCOPY WITH D & C N/A 11/24/2012   Procedure: DILATATION AND CURETTAGE /HYSTEROSCOPY;  Surgeon: Frederico Hamman, MD;  Location: [facility];  Service: Gynecology;  Laterality: N/A;  . repair of vaginal tear   1986   after SVD  . TUBAL LIGATION      OB History    Gravida  4   Para  4   Term      Preterm      AB      Living  4     SAB      TAB      Ectopic      Multiple      Live Births               Home Medications    Prior to Admission medications   Medication Sig Start Date End Date Taking? Authorizing Provider  aluminum-magnesium hydroxide-simethicone (MAALOX) 409-811-91 MG/5ML  SUSP Take 30 mLs by mouth 4 (four) times daily -  before meals and at bedtime. 01/03/19   Khatri, Hina, PA-C  Ferrous Sulfate (IRON PO) Take 1 tablet by mouth daily.    [provider]  Multiple Vitamins-Minerals (EYE VITAMINS & MINERALS PO) Take 1 tablet by mouth daily.    [provider]  Multiple Vitamins-Minerals (ZINC PO) Take 1 tablet by mouth daily.    [provider]  ondansetron (ZOFRAN ODT) 4 MG disintegrating tablet Take 1 tablet (4 mg total) by mouth every 8 (eight) hours as needed for nausea or vomiting. 01/03/19   Khatri, Hina, PA-C  oxybutynin (DITROPAN-XL) 10 MG 24 hr tablet Take 10 mg by mouth daily. 12/22/18   [provider]  ranitidine (ZANTAC) 150 MG capsule Take 150 mg by mouth 2 (two) times daily.    [provider]  RESTASIS 0.05 % ophthalmic emulsion Place 1 drop into both eyes daily. 07/19/18   [provider]  vitamin C (ASCORBIC ACID) 500 MG  tablet Take 500 mg by mouth daily.    [provider]    Family History History reviewed. No pertinent family history.  Social History Social History   Tobacco Use  . Smoking status: Never Smoker  . Smokeless tobacco: Never Used  Substance Use Topics  . Alcohol use: No  . Drug use: No     Allergies   Aspirin and Nsaids   Review of Systems Review of Systems  Reason unable to perform ROS: See HPI as above.     Physical Exam Triage Vital Signs ED Triage Vitals  Enc Vitals Group     BP 12/16/19 1452 130/90     Pulse Rate 12/16/19 1452 82     Resp 12/16/19 1452 18     Temp 12/16/19 1452 98.6 F (37 C)     Temp Source 12/16/19 1452 Oral     SpO2 12/16/19 1452 96 %     Weight --      Height --      Head Circumference --      Peak Flow --      Pain Score 12/16/19 1453 3     Pain Loc --      Pain Edu? --      Excl. in [place]? --    No data found.  Updated Vital Signs BP 130/90 (BP Location: Right Arm)   Pulse 82   Temp 98.6 F (37 C) (Oral)    Resp 18   SpO2 96%   Visual Acuity Right Eye Distance:   Left Eye Distance:   Bilateral Distance:    Right Eye Near:   Left Eye Near:    Bilateral Near:     Physical Exam Constitutional:      General: She is not in acute distress.    Appearance: Normal appearance. She is well-developed. She is not toxic-appearing or diaphoretic.  HENT:     Head: Normocephalic and atraumatic.  Eyes:     Conjunctiva/sclera: Conjunctivae normal.     Pupils: Pupils are equal, round, and reactive to light.  Pulmonary:     Effort: Pulmonary effort is normal. No respiratory distress.  Musculoskeletal:     Cervical back: Normal range of motion and neck supple.     Comments: Swelling to the left ankle, lateral foot. No erythema, warmth, contusion. Tenderness to palpation of lateral dorsal foot. No tenderness to lateral malleolus. Full ROM of ankle. NVI  Skin:    General: Skin is warm and dry.  Neurological:     Mental Status: She is alert and oriented to person, place, and time.      UC Treatments / Results  Labs (all labs ordered are listed, but only abnormal results are displayed) Labs Reviewed - No data to display  EKG   Radiology DG Foot Complete Left  Result Date: 12/16/2019 CLINICAL DATA:  Injury. LEFT foot pain after twisting injury 5 days ago. Initial encounter. Tender on the plantar surface of the cuboid area. EXAM: LEFT FOOT - COMPLETE 3+ VIEW COMPARISON:  None. FINDINGS: There is significant soft tissue swelling in the hindfoot. A small bone density is identified along the anterior aspect of the calcaneus, at the calcaneocuboid joint, suspicious for calcaneal fracture. IMPRESSION: Suspect calcaneal fracture. Consider further evaluation with CT exam. Electronically Signed   By: Nolon Nations M.D.   On: 12/16/2019 15:22    Procedures Procedures (including critical care time)  Medications Ordered in UC Medications - No data to display  Initial Impression /  Assessment and Plan  / UC Course  I have reviewed the triage vital signs and the nursing notes.  Pertinent labs & imaging results that were available during my care of the patient were reviewed by me and considered in my medical decision making (see chart for details).    Discussed xray results with patient, including consideration of CT scan. For now will provide cam walker and continue symptomatic treatment. Will have patient follow up with orthopedics for evaluation and further imaging needed. Return precautions given.  Final Clinical Impressions(s) / UC Diagnoses   Final diagnoses:  Left foot pain    ED Prescriptions    None     PDMP not reviewed this encounter.   Ok Edwards, PA-C 12/16/19 1545

## 2019-12-16 NOTE — ED Triage Notes (Signed)
Pt here for left ankle pain and swelling after twisting 5 days ago; pt sts using ice and swelling has decreased but not resolved

## 2019-12-16 NOTE — Discharge Instructions (Signed)
As discussed, xray suspicious for calcaneal fracture. Radiology recommended CT scan. Continue ice, elevation. Cam walker during activity. Follow up with orthopedics for further evaluation needed.

## 2021-10-09 IMAGING — MG DIGITAL SCREENING BILAT W/ TOMO W/ CAD
8 series · 8 of 24 positions shown · non-contrast
Comparison: Previous exam(s).

CLINICAL DATA: Screening.

EXAM:
DIGITAL SCREENING BILATERAL MAMMOGRAM WITH TOMO AND CAD

[L CC synth-2D]
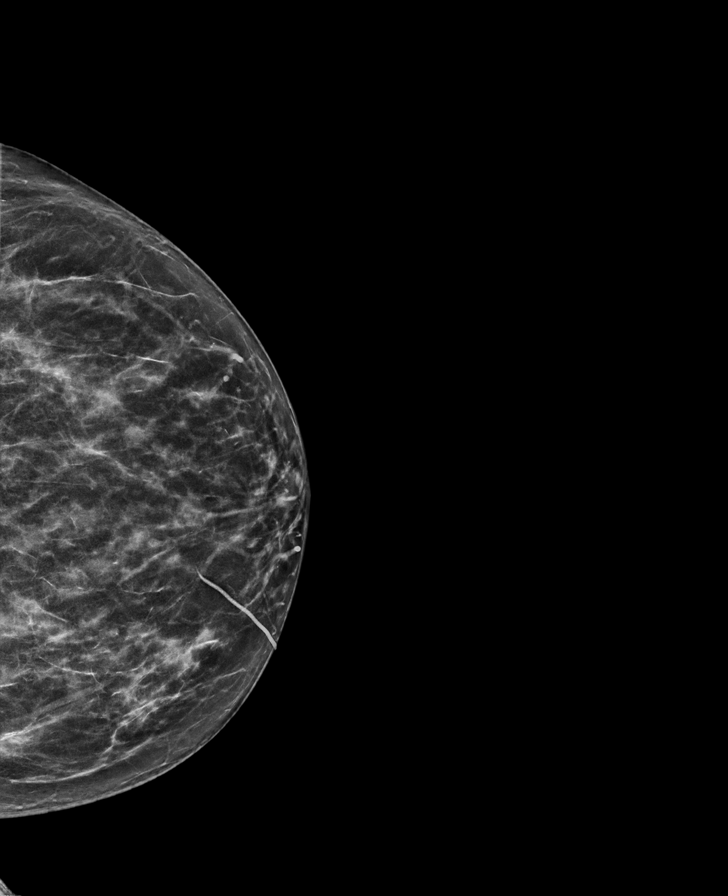

[R MLO synth-2D]
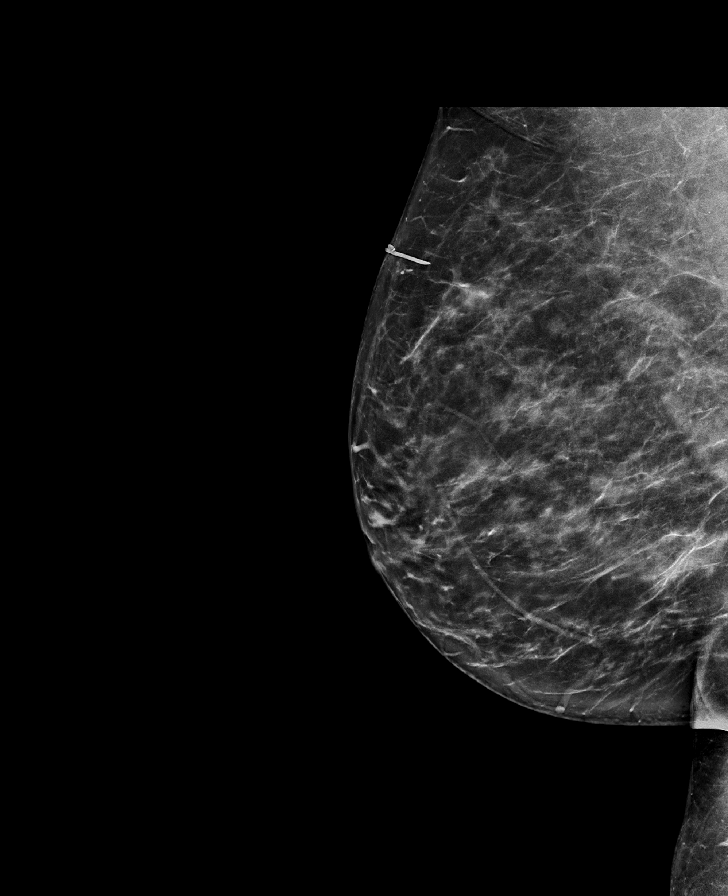

[L MLO synth-2D]
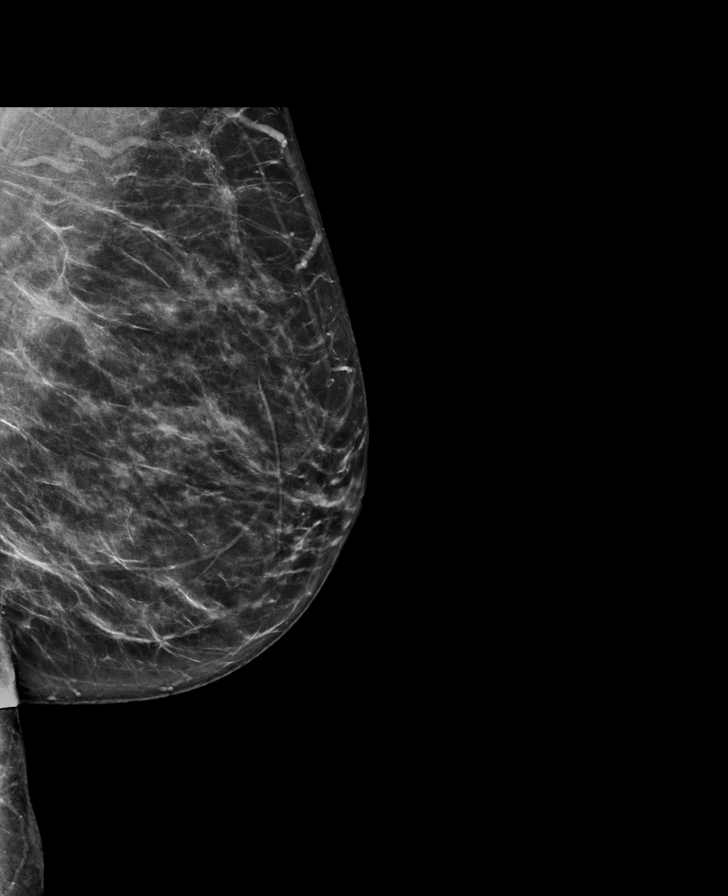

[R CC synth-2D]
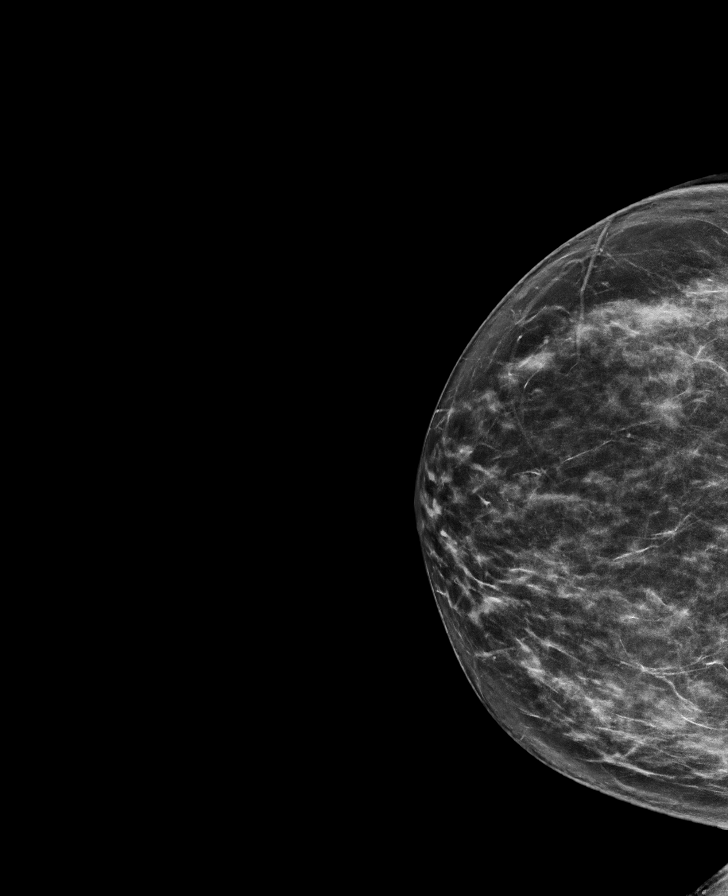

[R CC tomo · tomo slice 37/72.0]
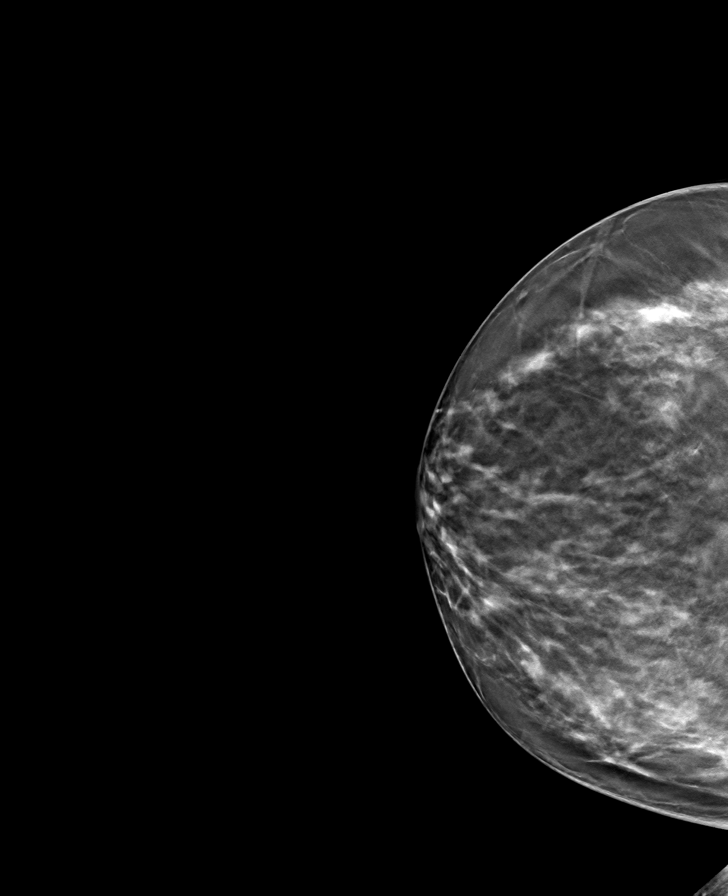

[L MLO tomo · tomo slice 36/71.0]
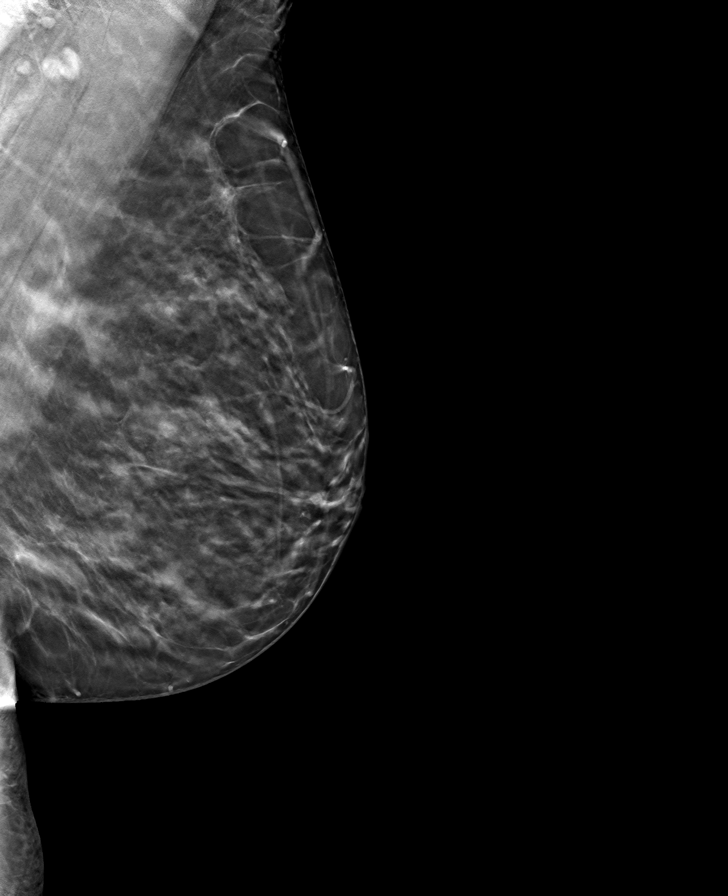

[L CC tomo · tomo slice 33/64.0]
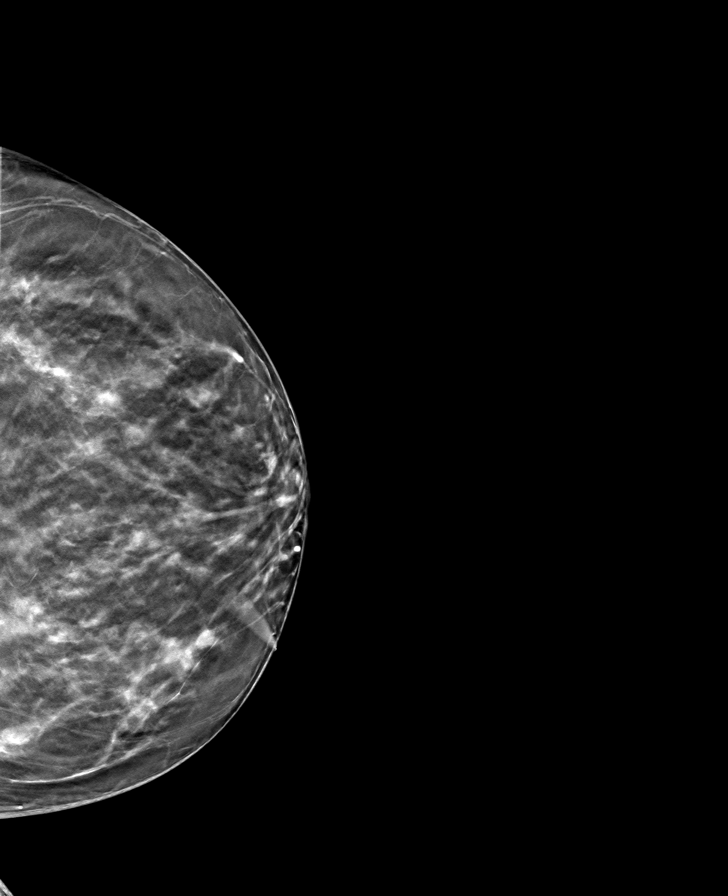

[R MLO tomo · tomo slice 39/78.0]
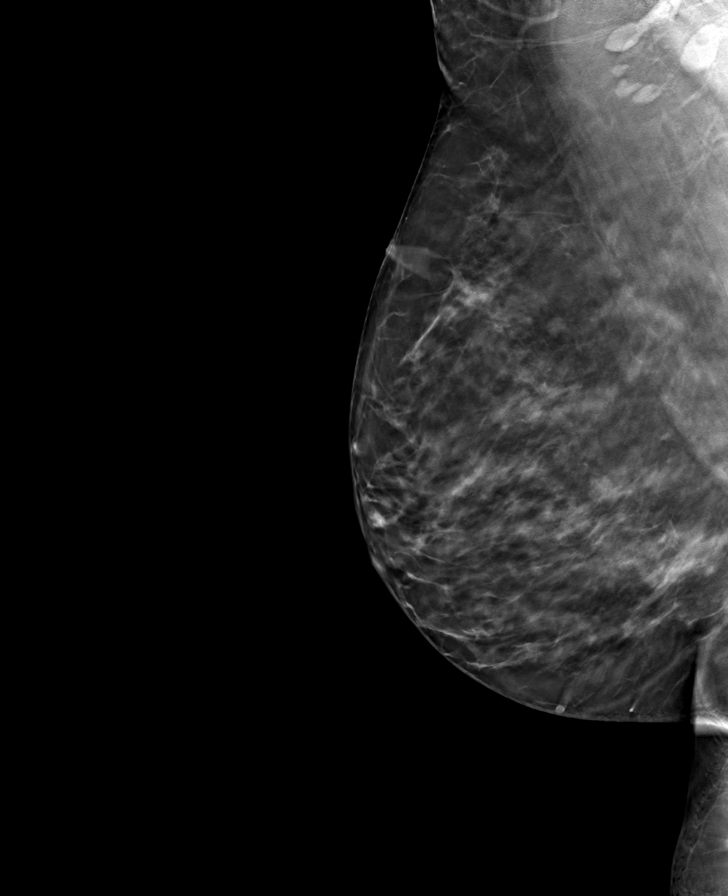

[8 of 24 positions shown; findings below may reference images not displayed]

ACR Breast Density Category c: The breast tissue is heterogeneously
dense, which may obscure small masses.
FINDINGS: There are no findings suspicious for malignancy. Images were
processed with CAD.
IMPRESSION: No mammographic evidence of malignancy. A result letter of this
screening mammogram will be mailed directly to the patient.

RECOMMENDATION:
Screening mammogram in one year. (Code:[id number])

BI-RADS CATEGORY  1: Negative.

## 2022-02-04 ENCOUNTER — Other Ambulatory Visit: Payer: Self-pay

## 2022-02-04 DIAGNOSIS — R928 Other abnormal and inconclusive findings on diagnostic imaging of breast: Secondary | ICD-10-CM

## 2022-02-20 ENCOUNTER — Encounter: Payer: Self-pay | Admitting: Obstetrics and Gynecology

## 2022-02-20 ENCOUNTER — Other Ambulatory Visit: Payer: Self-pay | Admitting: Obstetrics and Gynecology

## 2022-02-20 ENCOUNTER — Ambulatory Visit
Admission: RE | Admit: 2022-02-20 | Discharge: 2022-02-20 | Disposition: A | Discharge status: Home / Self Care | Payer: Managed Care, Other (non HMO) | Source: Ambulatory Visit | Attending: Obstetrics and Gynecology | Admitting: Obstetrics and Gynecology

## 2022-02-20 ENCOUNTER — Ambulatory Visit
Admission: RE | Admit: 2022-02-20 | Discharge: 2022-02-20 | Disposition: A | Discharge status: Home / Self Care | Payer: 59 | Source: Ambulatory Visit | Attending: Obstetrics and Gynecology | Admitting: Obstetrics and Gynecology

## 2022-02-20 DIAGNOSIS — R928 Other abnormal and inconclusive findings on diagnostic imaging of breast: Secondary | ICD-10-CM

## 2022-02-20 DIAGNOSIS — N6320 Unspecified lump in the left breast, unspecified quadrant: Secondary | ICD-10-CM

## 2022-02-27 ENCOUNTER — Ambulatory Visit
Admission: RE | Admit: 2022-02-27 | Discharge: 2022-02-27 | Disposition: A | Discharge status: Home / Self Care | Payer: Managed Care, Other (non HMO) | Source: Ambulatory Visit | Attending: Obstetrics and Gynecology | Admitting: Obstetrics and Gynecology

## 2022-02-27 ENCOUNTER — Other Ambulatory Visit: Payer: Self-pay | Admitting: General Practice

## 2022-02-27 DIAGNOSIS — N6320 Unspecified lump in the left breast, unspecified quadrant: Secondary | ICD-10-CM

## 2022-02-27 HISTORY — PX: BREAST BIOPSY: SHX20

## 2022-03-11 ENCOUNTER — Ambulatory Visit: Payer: Self-pay | Admitting: General Surgery

## 2022-03-11 DIAGNOSIS — D4862 Neoplasm of uncertain behavior of left breast: Secondary | ICD-10-CM

## 2022-03-14 ENCOUNTER — Other Ambulatory Visit: Payer: Self-pay | Admitting: General Surgery

## 2022-03-14 DIAGNOSIS — D4862 Neoplasm of uncertain behavior of left breast: Secondary | ICD-10-CM

## 2022-03-25 ENCOUNTER — Encounter: Payer: Self-pay | Admitting: Hematology and Oncology

## 2022-03-25 ENCOUNTER — Other Ambulatory Visit: Payer: Self-pay

## 2022-03-25 ENCOUNTER — Inpatient Hospital Stay: Payer: Managed Care, Other (non HMO)

## 2022-03-25 ENCOUNTER — Inpatient Hospital Stay: Payer: Managed Care, Other (non HMO) | Attending: Hematology and Oncology | Admitting: Hematology and Oncology

## 2022-03-25 VITALS — BP 126/75 | HR 76 | Temp 97.7°F | Resp 16 | Ht 68.0 in | Wt 191.6 lb

## 2022-03-25 DIAGNOSIS — Z79899 Other long term (current) drug therapy: Secondary | ICD-10-CM | POA: Diagnosis not present

## 2022-03-25 DIAGNOSIS — N3281 Overactive bladder: Secondary | ICD-10-CM | POA: Insufficient documentation

## 2022-03-25 DIAGNOSIS — D242 Benign neoplasm of left breast: Secondary | ICD-10-CM

## 2022-03-25 DIAGNOSIS — C50812 Malignant neoplasm of overlapping sites of left female breast: Secondary | ICD-10-CM | POA: Diagnosis present

## 2022-03-25 NOTE — Progress Notes (Signed)
[facility] CONSULT NOTE  Patient Care Team: Juanda Chance as PCP - General (Physician Assistant)  CHIEF COMPLAINTS/PURPOSE OF CONSULTATION:  Newly diagnosed breast cancer  HISTORY OF PRESENTING ILLNESS:  Susan Berry 57 y.o. female is here because of recent diagnosis of left phyllodes breast tumor.  Feb 20 2022, she had a recalled from screening mammogram which resulted in a diagnostic mammogram, this showed indeterminate 7 mm mass involving the upper left breast at the 12 o'clock position 2 cm from the nipple which may be intraductal in location.  No pathologic left axillary adenopathy.  There was also a benign 7 mm mass involving upper outer quadrant of left breast at 2 o'clock position 2 cm from the nipple at posterior depth unchanged dating back to 2020 mammogram.  Jan 01/07/2023, surgical pathology showed Susan Berry 2024 surgical pathology showed fibroepithelial neoplasm compatible with phyllodes tumor.  No stromal mitosis or atypia noted.  She is now scheduled for surgery on February 16 and is now referred to medical oncology for any additional adjuvant recommendations.  She is very healthy at baseline, works for [employer].  No known family history of breast cancer.  She had 4 children.  She does not take any over-the-counter medications except for mirabegron for overactive bladder.  Rest of the pertinent 10 point ROS reviewed and negative  I reviewed her records extensively and collaborated the history with the patient.  SUMMARY OF ONCOLOGIC HISTORY: Oncology History   No history exists.     MEDICAL HISTORY:  Past Medical History:  Diagnosis Date   Asthma    last use of inhaler 5 yrs ago   Enlarged thyroid gland    left side - f/u with md yearly   SVD (spontaneous vaginal delivery)    x 4   TIA (transient ischemic attack)     SURGICAL HISTORY: Past Surgical History:  Procedure Laterality Date   ABLATION     BREAST BIOPSY Left 02/27/2022    [place] LT BREAST BX W LOC DEV 1ST LESION IMG BX SPEC US GUIDE 02/27/2022 [facility]   BREAST CYST EXCISION Left 30 yrs ago   left   EYE SURGERY     left eye surgery   HYSTEROSCOPY WITH D & C N/A 11/24/2012   Procedure: DILATATION AND CURETTAGE /HYSTEROSCOPY;  Surgeon: Frederico Hamman, MD;  Location: [facility];  Service: Gynecology;  Laterality: N/A;   repair of vaginal tear   1986   after SVD   TUBAL LIGATION      SOCIAL HISTORY: Social History   Socioeconomic History   Marital status: Single    Spouse name: Not on file   Number of children: Not on file   Years of education: Not on file   Highest education level: Not on file  Occupational History   Not on file  Tobacco Use   Smoking status: Never   Smokeless tobacco: Never  Substance and Sexual Activity   Alcohol use: No   Drug use: No   Sexual activity: Yes    Birth control/protection: Surgical  Other Topics Concern   Not on file  Social History Narrative   Not on file   Social Determinants of Health   Financial Resource Strain: Not on file  Food Insecurity: Not on file  Transportation Needs: Not on file  Physical Activity: Not on file  Stress: Not on file  Social Connections: Not on file  Intimate Partner Violence: Not on file    FAMILY  HISTORY: History reviewed. No pertinent family history.  ALLERGIES:  is allergic to aspirin and nsaids.  MEDICATIONS:  Current Outpatient Medications  Medication Sig Dispense Refill   mirabegron ER (MYRBETRIQ) 50 MG TB24 tablet Take 50 mg by mouth daily.     Multiple Vitamins-Minerals (EYE VITAMINS & MINERALS PO) Take 1 tablet by mouth daily.     Multiple Vitamins-Minerals (ZINC PO) Take 1 tablet by mouth daily.     vitamin C (ASCORBIC ACID) 500 MG tablet Take 500 mg by mouth daily.     No current facility-administered medications for this visit.    REVIEW OF SYSTEMS:   Constitutional: Denies fevers, chills or abnormal night sweats Eyes: Denies blurriness of vision,  double vision or watery eyes Ears, nose, mouth, throat, and face: Denies mucositis or sore throat Respiratory: Denies cough, dyspnea or wheezes Cardiovascular: Denies palpitation, chest discomfort or lower extremity swelling Gastrointestinal:  Denies nausea, heartburn or change in bowel habits Skin: Denies abnormal skin rashes Lymphatics: Denies new lymphadenopathy or easy bruising Neurological:Denies numbness, tingling or new weaknesses Behavioral/Psych: Mood is stable, no new changes  Breast: Denies any palpable lumps or discharge All other systems were reviewed with the patient and are negative.  PHYSICAL EXAMINATION: ECOG PERFORMANCE STATUS: 0 - Asymptomatic  Vitals:   03/25/22 1057  BP: 126/75  Pulse: 76  Resp: 16  Temp: 97.7 F (36.5 C)  SpO2: 99%   Filed Weights   03/25/22 1057  Weight: 191 lb 9.6 oz (86.9 kg)    GENERAL:alert, no distress and comfortable SKIN: skin color, texture, turgor are normal, no rashes or significant lesions EYES: normal, conjunctiva are pink and non-injected, sclera clear OROPHARYNX:no exudate, no erythema and lips, buccal mucosa, and tongue normal  NECK: supple, thyroid normal size, non-tender, without nodularity LYMPH:  no palpable lymphadenopathy in the cervical, axillary or inguinal LUNGS: clear to auscultation and percussion with normal breathing effort HEART: regular rate & rhythm and no murmurs and no lower extremity edema ABDOMEN:abdomen soft, non-tender and normal bowel sounds Musculoskeletal:no cyanosis of digits and no clubbing  PSYCH: alert & oriented x 3 with fluent speech NEURO: no focal motor/sensory deficits BREAST: Palpable abnormal density in the left breast at 12:00 location.  LABORATORY DATA:  I have reviewed the data as listed Lab Results  Component Value Date   WBC 5.9 01/03/2019   HGB 13.8 01/03/2019   HCT 44.7 01/03/2019   MCV 96.1 01/03/2019   PLT 251 01/03/2019   Lab Results  Component Value Date   NA  141 01/03/2019   K 3.6 01/03/2019   CL 103 01/03/2019   CO2 24 01/03/2019    RADIOGRAPHIC STUDIES: I have personally reviewed the radiological reports and agreed with the findings in the report.  ASSESSMENT AND PLAN:    This is a very pleasant 57 year old female patient with newly diagnosed left breast phyllodes tumor without any stromal mitoses or atypia scheduled for resection referred to medical oncology for adjuvant recommendations. We have discussed that for low-dose tumors around common fibroepithelial breast tumors and usually behave like benign fibroadenomas although have a propensity to recur locally.  Sometimes malignant phyllodes can metastasize distantly.  The name for low-dose comes because of leaf-like projections that are seen on pathological exam.  Most common method of treatment include surgical resection.  Typically there is no role for adjuvant chemotherapy based on limited data.  Patients with benign or borderline for low-dose tumors are cured with surgery.  We have discussed that there is no  role of adjuvant chemotherapy and it is not routinely offered.  There is also no role for antiestrogen therapy in phyllodes tumors.  She will return to clinic after surgery to review final pathology and to discuss any additional recommendations.  Total time spent: 30 min  All questions were answered. The patient knows to call the clinic with any problems, questions or concerns.    Benay Pike, MD 03/25/22

## 2022-03-26 ENCOUNTER — Telehealth: Payer: Self-pay | Admitting: Hematology and Oncology

## 2022-03-26 NOTE — Telephone Encounter (Signed)
Spoke with patient confirming upcoming appointment 

## 2022-03-28 ENCOUNTER — Encounter (HOSPITAL_BASED_OUTPATIENT_CLINIC_OR_DEPARTMENT_OTHER): Payer: Self-pay | Admitting: General Surgery

## 2022-04-03 ENCOUNTER — Ambulatory Visit
Admission: RE | Admit: 2022-04-03 | Discharge: 2022-04-03 | Disposition: A | Discharge status: Home / Self Care | Payer: Managed Care, Other (non HMO) | Source: Ambulatory Visit | Attending: General Surgery | Admitting: General Surgery

## 2022-04-03 DIAGNOSIS — D4862 Neoplasm of uncertain behavior of left breast: Secondary | ICD-10-CM

## 2022-04-03 HISTORY — PX: BREAST BIOPSY: SHX20

## 2022-04-03 NOTE — Progress Notes (Signed)

## 2022-04-04 ENCOUNTER — Ambulatory Visit (HOSPITAL_BASED_OUTPATIENT_CLINIC_OR_DEPARTMENT_OTHER): Payer: Managed Care, Other (non HMO) | Admitting: Anesthesiology

## 2022-04-04 ENCOUNTER — Other Ambulatory Visit: Payer: Self-pay

## 2022-04-04 ENCOUNTER — Ambulatory Visit
Admission: RE | Admit: 2022-04-04 | Discharge: 2022-04-04 | Disposition: A | Discharge status: Home / Self Care | Payer: Managed Care, Other (non HMO) | Source: Ambulatory Visit | Attending: General Surgery | Admitting: General Surgery

## 2022-04-04 ENCOUNTER — Ambulatory Visit (HOSPITAL_BASED_OUTPATIENT_CLINIC_OR_DEPARTMENT_OTHER)
Admission: RE | Admit: 2022-04-04 | Discharge: 2022-04-04 | Disposition: A | Discharge status: Home / Self Care | Payer: Managed Care, Other (non HMO) | Source: Ambulatory Visit | Attending: General Surgery | Admitting: General Surgery

## 2022-04-04 ENCOUNTER — Encounter (HOSPITAL_BASED_OUTPATIENT_CLINIC_OR_DEPARTMENT_OTHER)
Admission: RE | Disposition: A | Discharge status: Home / Self Care | Payer: Self-pay | Source: Ambulatory Visit | Attending: General Surgery

## 2022-04-04 ENCOUNTER — Encounter (HOSPITAL_BASED_OUTPATIENT_CLINIC_OR_DEPARTMENT_OTHER): Payer: Self-pay | Admitting: General Surgery

## 2022-04-04 DIAGNOSIS — J45909 Unspecified asthma, uncomplicated: Secondary | ICD-10-CM | POA: Insufficient documentation

## 2022-04-04 DIAGNOSIS — D4862 Neoplasm of uncertain behavior of left breast: Secondary | ICD-10-CM

## 2022-04-04 DIAGNOSIS — D242 Benign neoplasm of left breast: Secondary | ICD-10-CM | POA: Insufficient documentation

## 2022-04-04 DIAGNOSIS — Z01818 Encounter for other preprocedural examination: Secondary | ICD-10-CM

## 2022-04-04 HISTORY — PX: BREAST LUMPECTOMY WITH RADIOACTIVE SEED LOCALIZATION: SHX6424

## 2022-04-04 SURGERY — BREAST LUMPECTOMY WITH RADIOACTIVE SEED LOCALIZATION
Anesthesia: General | Site: Breast | Laterality: Left

## 2022-04-04 MED ORDER — ONDANSETRON HCL 4 MG/2ML IJ SOLN
INTRAMUSCULAR | Status: DC | PRN
  Administered 2022-04-04: 4 mg via INTRAVENOUS

## 2022-04-04 MED ORDER — MIDAZOLAM HCL 5 MG/5ML IJ SOLN
INTRAMUSCULAR | Status: DC | PRN
  Administered 2022-04-04 (×2): 1 mg via INTRAVENOUS

## 2022-04-04 MED ORDER — KETOROLAC TROMETHAMINE 30 MG/ML IJ SOLN
INTRAMUSCULAR | Status: AC
  Filled 2022-04-04: qty 1

## 2022-04-04 MED ORDER — FENTANYL CITRATE (PF) 100 MCG/2ML IJ SOLN: 25.0000 ug | INTRAMUSCULAR | Status: DC | PRN

## 2022-04-04 MED ORDER — GABAPENTIN 300 MG PO CAPS
ORAL_CAPSULE | ORAL | Status: AC
  Filled 2022-04-04: qty 1

## 2022-04-04 MED ORDER — MEPERIDINE HCL 25 MG/ML IJ SOLN: 6.2500 mg | INTRAMUSCULAR | Status: DC | PRN

## 2022-04-04 MED ORDER — LIDOCAINE HCL (CARDIAC) PF 100 MG/5ML IV SOSY
PREFILLED_SYRINGE | INTRAVENOUS | Status: DC | PRN
  Administered 2022-04-04: 50 mg via INTRAVENOUS

## 2022-04-04 MED ORDER — LIDOCAINE 2% (20 MG/ML) 5 ML SYRINGE
INTRAMUSCULAR | Status: AC
  Filled 2022-04-04: qty 5

## 2022-04-04 MED ORDER — PHENYLEPHRINE HCL (PRESSORS) 10 MG/ML IV SOLN
INTRAVENOUS | Status: DC | PRN
  Administered 2022-04-04: 80 ug via INTRAVENOUS

## 2022-04-04 MED ORDER — ONDANSETRON HCL 4 MG/2ML IJ SOLN
INTRAMUSCULAR | Status: AC
  Filled 2022-04-04: qty 2

## 2022-04-04 MED ORDER — FENTANYL CITRATE (PF) 100 MCG/2ML IJ SOLN
INTRAMUSCULAR | Status: DC | PRN
  Administered 2022-04-04 (×2): 50 ug via INTRAVENOUS

## 2022-04-04 MED ORDER — OXYCODONE HCL 5 MG PO TABS: 5.0000 mg | ORAL_TABLET | Freq: Once | ORAL | Status: DC | PRN

## 2022-04-04 MED ORDER — DEXAMETHASONE SODIUM PHOSPHATE 4 MG/ML IJ SOLN
INTRAMUSCULAR | Status: DC | PRN
  Administered 2022-04-04: 5 mg via INTRAVENOUS

## 2022-04-04 MED ORDER — PROMETHAZINE HCL 25 MG/ML IJ SOLN: 6.2500 mg | INTRAMUSCULAR | Status: DC | PRN

## 2022-04-04 MED ORDER — CEFAZOLIN SODIUM-DEXTROSE 2-4 GM/100ML-% IV SOLN
INTRAVENOUS | Status: AC
  Filled 2022-04-04: qty 100

## 2022-04-04 MED ORDER — CEFAZOLIN SODIUM-DEXTROSE 2-4 GM/100ML-% IV SOLN
2.0000 g | INTRAVENOUS | Status: AC
  Administered 2022-04-04: 2 g via INTRAVENOUS

## 2022-04-04 MED ORDER — OXYCODONE HCL 5 MG PO TABS: 5.0000 mg | ORAL_TABLET | Freq: Four times a day (QID) | ORAL | 0 refills | Status: AC | PRN

## 2022-04-04 MED ORDER — CHLORHEXIDINE GLUCONATE CLOTH 2 % EX PADS: 6.0000 | MEDICATED_PAD | Freq: Once | CUTANEOUS | Status: DC

## 2022-04-04 MED ORDER — LACTATED RINGERS IV SOLN: INTRAVENOUS | Status: DC

## 2022-04-04 MED ORDER — BUPIVACAINE HCL (PF) 0.25 % IJ SOLN
INTRAMUSCULAR | Status: DC | PRN
  Administered 2022-04-04: 20 mL

## 2022-04-04 MED ORDER — DEXAMETHASONE SODIUM PHOSPHATE 10 MG/ML IJ SOLN
INTRAMUSCULAR | Status: AC
  Filled 2022-04-04: qty 1

## 2022-04-04 MED ORDER — AMISULPRIDE (ANTIEMETIC) 5 MG/2ML IV SOLN: 10.0000 mg | Freq: Once | INTRAVENOUS | Status: DC | PRN

## 2022-04-04 MED ORDER — FENTANYL CITRATE (PF) 100 MCG/2ML IJ SOLN
INTRAMUSCULAR | Status: AC
  Filled 2022-04-04: qty 2

## 2022-04-04 MED ORDER — GLYCOPYRROLATE 0.2 MG/ML IJ SOLN
INTRAMUSCULAR | Status: DC | PRN
  Administered 2022-04-04 (×2): .1 mg via INTRAVENOUS

## 2022-04-04 MED ORDER — MIDAZOLAM HCL 2 MG/2ML IJ SOLN
INTRAMUSCULAR | Status: AC
  Filled 2022-04-04: qty 2

## 2022-04-04 MED ORDER — GABAPENTIN 300 MG PO CAPS
300.0000 mg | ORAL_CAPSULE | ORAL | Status: AC
  Administered 2022-04-04: 300 mg via ORAL

## 2022-04-04 MED ORDER — PROPOFOL 10 MG/ML IV BOLUS
INTRAVENOUS | Status: AC
  Filled 2022-04-04: qty 20

## 2022-04-04 MED ORDER — ACETAMINOPHEN 500 MG PO TABS
ORAL_TABLET | ORAL | Status: AC
  Filled 2022-04-04: qty 2

## 2022-04-04 MED ORDER — OXYCODONE HCL 5 MG/5ML PO SOLN: 5.0000 mg | Freq: Once | ORAL | Status: DC | PRN

## 2022-04-04 MED ORDER — ACETAMINOPHEN 500 MG PO TABS
1000.0000 mg | ORAL_TABLET | ORAL | Status: AC
  Administered 2022-04-04: 1000 mg via ORAL

## 2022-04-04 MED ORDER — PROPOFOL 10 MG/ML IV BOLUS
INTRAVENOUS | Status: DC | PRN
  Administered 2022-04-04: 200 mg via INTRAVENOUS

## 2022-04-04 SURGICAL SUPPLY — 43 items
ADH SKN CLS APL DERMABOND .7 (GAUZE/BANDAGES/DRESSINGS) ×1
APL PRP STRL LF DISP 70% ISPRP (MISCELLANEOUS) ×1
APPLIER CLIP 9.375 MED OPEN (MISCELLANEOUS)
APR CLP MED 9.3 20 MLT OPN (MISCELLANEOUS)
BLADE SURG 15 STRL LF DISP TIS (BLADE) ×1 IMPLANT
BLADE SURG 15 STRL SS (BLADE) ×1
CANISTER SUC SOCK COL 7IN (MISCELLANEOUS) ×1 IMPLANT
CANISTER SUCT 1200ML W/VALVE (MISCELLANEOUS) ×1 IMPLANT
CHLORAPREP W/TINT 26 (MISCELLANEOUS) ×1 IMPLANT
CLIP APPLIE 9.375 MED OPEN (MISCELLANEOUS) IMPLANT
COVER BACK TABLE 60X90IN (DRAPES) ×1 IMPLANT
COVER MAYO STAND STRL (DRAPES) ×1 IMPLANT
COVER PROBE CYLINDRICAL 5X96 (MISCELLANEOUS) ×1 IMPLANT
DERMABOND ADVANCED .7 DNX12 (GAUZE/BANDAGES/DRESSINGS) ×1 IMPLANT
DRAPE LAPAROSCOPIC ABDOMINAL (DRAPES) ×1 IMPLANT
DRAPE UTILITY XL STRL (DRAPES) ×1 IMPLANT
ELECT COATED BLADE 2.86 ST (ELECTRODE) ×1 IMPLANT
ELECT REM PT RETURN 9FT ADLT (ELECTROSURGICAL) ×1
ELECTRODE REM PT RTRN 9FT ADLT (ELECTROSURGICAL) ×1 IMPLANT
GLOVE BIO SURGEON STRL SZ7.5 (GLOVE) ×2 IMPLANT
GLOVE BIOGEL PI IND STRL 7.0 (GLOVE) IMPLANT
GLOVE ECLIPSE 6.5 STRL STRAW (GLOVE) IMPLANT
GOWN STRL REUS W/ TWL LRG LVL3 (GOWN DISPOSABLE) ×2 IMPLANT
GOWN STRL REUS W/TWL LRG LVL3 (GOWN DISPOSABLE) ×2
ILLUMINATOR WAVEGUIDE N/F (MISCELLANEOUS) IMPLANT
KIT MARKER MARGIN INK (KITS) ×1 IMPLANT
LIGHT WAVEGUIDE WIDE FLAT (MISCELLANEOUS) IMPLANT
NDL HYPO 25X1 1.5 SAFETY (NEEDLE) IMPLANT
NEEDLE HYPO 25X1 1.5 SAFETY (NEEDLE) ×1 IMPLANT
NS IRRIG 1000ML POUR BTL (IV SOLUTION) IMPLANT
PACK BASIN DAY SURGERY FS (CUSTOM PROCEDURE TRAY) ×1 IMPLANT
PENCIL SMOKE EVACUATOR (MISCELLANEOUS) ×1 IMPLANT
SLEEVE SCD COMPRESS KNEE MED (STOCKING) ×1 IMPLANT
SPIKE FLUID TRANSFER (MISCELLANEOUS) IMPLANT
SPONGE T-LAP 18X18 ~~LOC~~+RFID (SPONGE) ×1 IMPLANT
SUT MON AB 4-0 PC3 18 (SUTURE) ×1 IMPLANT
SUT SILK 2 0 SH (SUTURE) IMPLANT
SUT VICRYL 3-0 CR8 SH (SUTURE) ×1 IMPLANT
SYR CONTROL 10ML LL (SYRINGE) IMPLANT
TOWEL GREEN STERILE FF (TOWEL DISPOSABLE) ×1 IMPLANT
TRAY FAXITRON CT DISP (TRAY / TRAY PROCEDURE) ×1 IMPLANT
TUBE CONNECTING 20X1/4 (TUBING) ×1 IMPLANT
YANKAUER SUCT BULB TIP NO VENT (SUCTIONS) IMPLANT

## 2022-04-04 NOTE — Anesthesia Procedure Notes (Signed)
Procedure Name: LMA Insertion Date/Time: 04/04/2022 9:33 AM  Performed by: Bufford Spikes, CRNAPre-anesthesia Checklist: Patient identified, Emergency Drugs available, Suction available and Patient being monitored Patient Re-evaluated:Patient Re-evaluated prior to induction Oxygen Delivery Method: Circle system utilized Preoxygenation: Pre-oxygenation with 100% oxygen Induction Type: IV induction Ventilation: Mask ventilation without difficulty LMA: LMA inserted LMA Size: 4.0 Number of attempts: 1 Placement Confirmation: positive ETCO2 Tube secured with: Tape Dental Injury: Teeth and Oropharynx as per pre-operative assessment

## 2022-04-04 NOTE — H&P (Signed)
REFERRING PHYSICIAN: Jake Samples, MD  PROVIDER: Landry Corporal, MD  MRN: [id number] DOB: Feb 10, 1966 Subjective  Chief Complaint: New Consultation (Left Breast)   History of Present Illness: Susan Berry is a 57 y.o. female who is seen today as an office consultation for evaluation of New Consultation (Left Breast) .  We are asked to see the patient in consultation by Dr. Marylynn Pearson to evaluate her for a phylloides tumor of the left breast. The patient is a 57 year old black female who recently went for a routine screening mammogram. At that time she was found to have a 7 mm mass in the upper portion of the left breast. This was biopsied and came back as a fibroepithelial lesion consistent with possible phylloides tumor. There was no atypia associated with it. She also had a 7 mm mass in the upper outer quadrant of the left breast that was benign appearing. She has no family history of breast cancer. She is otherwise in good health and does not smoke.  Review of Systems: A complete review of systems was obtained from the patient. I have reviewed this information and discussed as appropriate with the patient. See HPI as well for other ROS.  ROS  Medical History: Past Medical History: Diagnosis Date Asthma, unspecified asthma severity, unspecified whether complicated, unspecified whether persistent  Patient Active Problem List Diagnosis Breast mass in female  Past Surgical History: Procedure Laterality Date MASTECTOMY PARTIAL / LUMPECTOMY Right partial thyroidectomy   Allergies Allergen Reactions Aspirin Other (See Comments) Causes asthma and bronchitis flare ups Does not take other NSAIDs either Causes asthma and bronchitis flare ups Does not take other NSAIDs either  Causes asthma and bronchitis flare ups Does not take other NSAIDs either  Current Outpatient Medications on File Prior to Visit Medication Sig Dispense Refill albuterol 90 mcg/actuation  inhaler Inhale into the lungs every 4 (four) hours as needed cycloSPORINE (RESTASIS) 0.05 % ophthalmic emulsion Place 1 drop into both eyes 2 (two) times daily mirabegron (MYRBETRIQ) 50 mg ER tablet Take by mouth  No current facility-administered medications on file prior to visit.  Family History Problem Relation Age of Onset High blood pressure (Hypertension) Mother Hyperlipidemia (Elevated cholesterol) Mother Diabetes Mother Diabetes Sister   Social History  Tobacco Use Smoking Status Never Smokeless Tobacco Never   Social History  Socioeconomic History Marital status: Single Tobacco Use Smoking status: Never Smokeless tobacco: Never  Objective:  Vitals: BP: 128/82 Pulse: 97 Weight: 87.4 kg (192 lb 9.6 oz) Height: 167.6 cm (5' 6"$ )  Body mass index is 31.09 kg/m.  Physical Exam Vitals reviewed. Constitutional: General: She is not in acute distress. Appearance: Normal appearance. HENT: Head: Normocephalic and atraumatic. Right Ear: External ear normal. Left Ear: External ear normal. Nose: Nose normal. Mouth/Throat: Mouth: Mucous membranes are moist. Pharynx: Oropharynx is clear. Eyes: General: No scleral icterus. Extraocular Movements: Extraocular movements intact. Conjunctiva/sclera: Conjunctivae normal. Pupils: Pupils are equal, round, and reactive to light. Cardiovascular: Rate and Rhythm: Normal rate and regular rhythm. Pulses: Normal pulses. Heart sounds: Normal heart sounds. Pulmonary: Effort: Pulmonary effort is normal. No respiratory distress. Breath sounds: Normal breath sounds. Abdominal: General: Bowel sounds are normal. Palpations: Abdomen is soft. Tenderness: There is no abdominal tenderness. Musculoskeletal: General: No swelling, tenderness or deformity. Normal range of motion. Cervical back: Normal range of motion and neck supple. Skin: General: Skin is warm and dry. Coloration: Skin is not jaundiced. Neurological: General:  No focal deficit present. Mental Status: She is alert and oriented to person,  place, and time. Psychiatric: Mood and Affect: Mood normal. Behavior: Behavior normal.    Breast: There is no palpable mass in either breast. There is no palpable axillary, supraclavicular, or cervical lymphadenopathy.  Labs, Imaging and Diagnostic Testing:  Assessment and Plan:  Diagnoses and all orders for this visit:  Breast mass in female    The patient appears to have a possible phylloides tumor in the left breast measuring 7 mm. At this point my recommendation for this would be wide excision of the area. I have discussed with her in detail the risks and benefits of the operation as well as some of the technical aspects including the use of a radioactive seed for localization and she understands and wishes to proceed. I will also refer her to medical and radiation oncology to discuss any potential adjuvant therapy.

## 2022-04-04 NOTE — Interval H&P Note (Signed)
History and Physical Interval Note:  04/04/2022 9:18 AM  Susan Berry  has presented today for surgery, with the diagnosis of LEFT BREAST PHYLLOIDES TUMOR.  The various methods of treatment have been discussed with the patient and family. After consideration of risks, benefits and other options for treatment, the patient has consented to  Procedure(s): LEFT BREAST LUMPECTOMY WITH RADIOACTIVE SEED LOCALIZATION (Left) as a surgical intervention.  The patient's history has been reviewed, patient examined, no change in status, stable for surgery.  I have reviewed the patient's chart and labs.  Questions were answered to the patient's satisfaction.     [name]

## 2022-04-04 NOTE — Transfer of Care (Signed)
Immediate Anesthesia Transfer of Care Note  Patient: Susan Berry  Procedure(s) Performed: LEFT BREAST LUMPECTOMY WITH RADIOACTIVE SEED LOCALIZATION (Left: Breast)  Patient Location: PACU  Anesthesia Type:General  Level of Consciousness: awake, alert , and oriented  Airway & Oxygen Therapy: Patient Spontanous Breathing and Patient connected to face mask oxygen  Post-op Assessment: Report given to RN and Post -op Vital signs reviewed and stable  Post vital signs: Reviewed and stable  Last Vitals:  Vitals Value Taken Time  BP 103/78 04/04/22 1015  Temp    Pulse 84 04/04/22 1016  Resp 19 04/04/22 1016  SpO2 100 % 04/04/22 1016  Vitals shown include unvalidated device data.  Last Pain:  Vitals:   04/04/22 0838  TempSrc: Oral  PainSc: 0-No pain      Patients Stated Pain Goal: 5 ([phone] [id number])  Complications: No notable events documented.

## 2022-04-04 NOTE — Discharge Instructions (Signed)
  Post Anesthesia Home Care Instructions  Activity: Get plenty of rest for the remainder of the day. A responsible individual must stay with you for 24 hours following the procedure.  For the next 24 hours, DO NOT: -Drive a car -Paediatric nurse -Drink alcoholic beverages -Take any medication unless instructed by your physician -Make any legal decisions or sign important papers.  Meals: Start with liquid foods such as gelatin or soup. Progress to regular foods as tolerated. Avoid greasy, spicy, heavy foods. If nausea and/or vomiting occur, drink only clear liquids until the nausea and/or vomiting subsides. Call your physician if vomiting continues.  Special Instructions/Symptoms: Your throat may feel dry or sore from the anesthesia or the breathing tube placed in your throat during surgery. If this causes discomfort, gargle with warm salt water. The discomfort should disappear within 24 hours.       Tylenol after 2:40 pm

## 2022-04-04 NOTE — Op Note (Signed)
04/04/2022  10:07 AM  PATIENT:  Susan Berry  57 y.o. female  PRE-OPERATIVE DIAGNOSIS:  LEFT BREAST PHYLLOIDES TUMOR  POST-OPERATIVE DIAGNOSIS:  LEFT BREAST PHYLLOIDES TUMOR  PROCEDURE:  Procedure(s): LEFT BREAST LUMPECTOMY WITH RADIOACTIVE SEED LOCALIZATION (Left)  SURGEON:  Surgeon(s) and Role:    * Jovita Kussmaul, MD - Primary  PHYSICIAN ASSISTANT:   ASSISTANTS: none   ANESTHESIA:   local and general  EBL:  5 mL   BLOOD ADMINISTERED:none  DRAINS: none   LOCAL MEDICATIONS USED:  MARCAINE     SPECIMEN:  Source of Specimen:  left breast tissue with additional deep margin  DISPOSITION OF SPECIMEN:  PATHOLOGY  COUNTS:  YES  TOURNIQUET:  * No tourniquets in log *  DICTATION: .Dragon Dictation  After informed consent was obtained the patient was brought to the operating room and placed in the supine position on the operating table.  After adequate induction of general anesthesia the patient's left breast was prepped with ChloraPrep, allowed to dry, and draped in usual sterile manner.  An appropriate timeout was performed.  Previously an I-125 seed was placed in the upper portion of the left breast to mark an area of possible phylloides tumor.  The neoprobe was set to I-125 in the area of radioactivity was readily identified.  The area around this was infiltrated with quarter percent Marcaine.  Curvilinear incision was made along the upper edge of the areola of the left breast with a 15 blade knife.  The incision was carried through the skin and subcutaneous tissue sharply with the electrocautery.  Dissection was then carried towards the radioactive seed under the direction of the neoprobe.  Once I more closely approach the radioactive seed I then removed a circular portion of breast tissue sharply with the electrocautery around the radioactive seed while checking the area of radioactivity frequently.  When the specimen was removed noticed that the seed was at the base of the  cavity.  The seed was removed and sent to pathology separately.  The lumpectomy tissue was then oriented with the appropriate paint colors.  An additional deep margin was then also taken and marked appropriately.  A specimen radiograph was obtained that showed the clip to be at the deep edge of the initial lumpectomy.  All of the tissue was then sent to pathology for further evaluation.  Hemostasis was achieved using the Bovie electrocautery.  The wound was irrigated with saline and infiltrated with more quarter percent Marcaine.  The deep layer of the incision was then closed with layers of interrupted 3-0 Vicryl stitches.  The skin was then closed with interrupted 4-0 Monocryl subcuticular stitches.  Dermabond dressings were applied.  The patient tolerated the procedure well.  At the end of the case all needle sponge and instrument counts were correct.  The patient was then awakened and taken to recovery in stable condition.  PLAN OF CARE: Discharge to home after PACU  PATIENT DISPOSITION:  PACU - hemodynamically stable.   Delay start of Pharmacological VTE agent (>24hrs) due to surgical blood loss or risk of bleeding: not applicable

## 2022-04-04 NOTE — Anesthesia Preprocedure Evaluation (Addendum)
Anesthesia Evaluation  Patient identified by MRN, date of birth, ID band Patient awake    Reviewed: Allergy & Precautions, NPO status , Patient's Chart, lab work & pertinent test results  Airway Mallampati: II  TM Distance: >3 FB Neck ROM: Full    Dental  (+) Dental Advisory Given, Teeth Intact, Missing   Pulmonary shortness of breath, asthma    Pulmonary exam normal breath sounds clear to auscultation       Cardiovascular negative cardio ROS Normal cardiovascular exam Rhythm:Regular Rate:Normal     Neuro/Psych  PSYCHIATRIC DISORDERS Anxiety     TIA   GI/Hepatic negative GI ROS, Neg liver ROS,,,  Endo/Other  negative endocrine ROS    Renal/GU negative Renal ROS     Musculoskeletal negative musculoskeletal ROS (+)    Abdominal   Peds  Hematology negative hematology ROS (+)   Anesthesia Other Findings   Reproductive/Obstetrics                             Anesthesia Physical Anesthesia Plan  ASA: 2  Anesthesia Plan: General   Post-op Pain Management: Tylenol PO (pre-op)* and Gabapentin PO (pre-op)*   Induction: Intravenous  PONV Risk Score and Plan: 3 and Ondansetron, Dexamethasone, Treatment may vary due to age or medical condition and Midazolam  Airway Management Planned: LMA  Additional Equipment: None  Intra-op Plan:   Post-operative Plan: Extubation in OR  Informed Consent: I have reviewed the patients History and Physical, chart, labs and discussed the procedure including the risks, benefits and alternatives for the proposed anesthesia with the patient or authorized representative who has indicated his/her understanding and acceptance.     Dental advisory given  Plan Discussed with: CRNA  Anesthesia Plan Comments:        Anesthesia Quick Evaluation

## 2022-04-05 NOTE — Anesthesia Postprocedure Evaluation (Signed)
Anesthesia Post Note  Patient: Susan Berry  Procedure(s) Performed: LEFT BREAST LUMPECTOMY WITH RADIOACTIVE SEED LOCALIZATION (Left: Breast)     Patient location during evaluation: PACU Anesthesia Type: General Level of consciousness: sedated and patient cooperative Pain management: pain level controlled Vital Signs Assessment: post-procedure vital signs reviewed and stable Respiratory status: spontaneous breathing Cardiovascular status: stable Anesthetic complications: no   No notable events documented.  Last Vitals:  Vitals:   04/04/22 1115 04/04/22 1125  BP: 109/79 (!) 116/97  Pulse: 60 79  Resp: (!) 23 20  Temp:  (!) 36.4 C  SpO2: 98% 98%    Last Pain:  Vitals:   04/04/22 1125  TempSrc:   PainSc: 2                  Nolon Nations

## 2022-04-07 ENCOUNTER — Encounter (HOSPITAL_BASED_OUTPATIENT_CLINIC_OR_DEPARTMENT_OTHER): Payer: Self-pay | Admitting: General Surgery

## 2022-04-07 LAB — SURGICAL PATHOLOGY

## 2022-04-15 ENCOUNTER — Inpatient Hospital Stay: Payer: Managed Care, Other (non HMO) | Admitting: Hematology and Oncology

## 2022-09-14 IMAGING — DX DG FOOT COMPLETE 3+V*L*
3 series · 3 of 3 positions shown · non-contrast
Comparison: None.

CLINICAL DATA: Injury. LEFT foot pain after twisting injury 5 days
ago. Initial encounter. Tender on the plantar surface of the cuboid
area.

EXAM:
LEFT FOOT - COMPLETE 3+ VIEW

[foot supine dp]
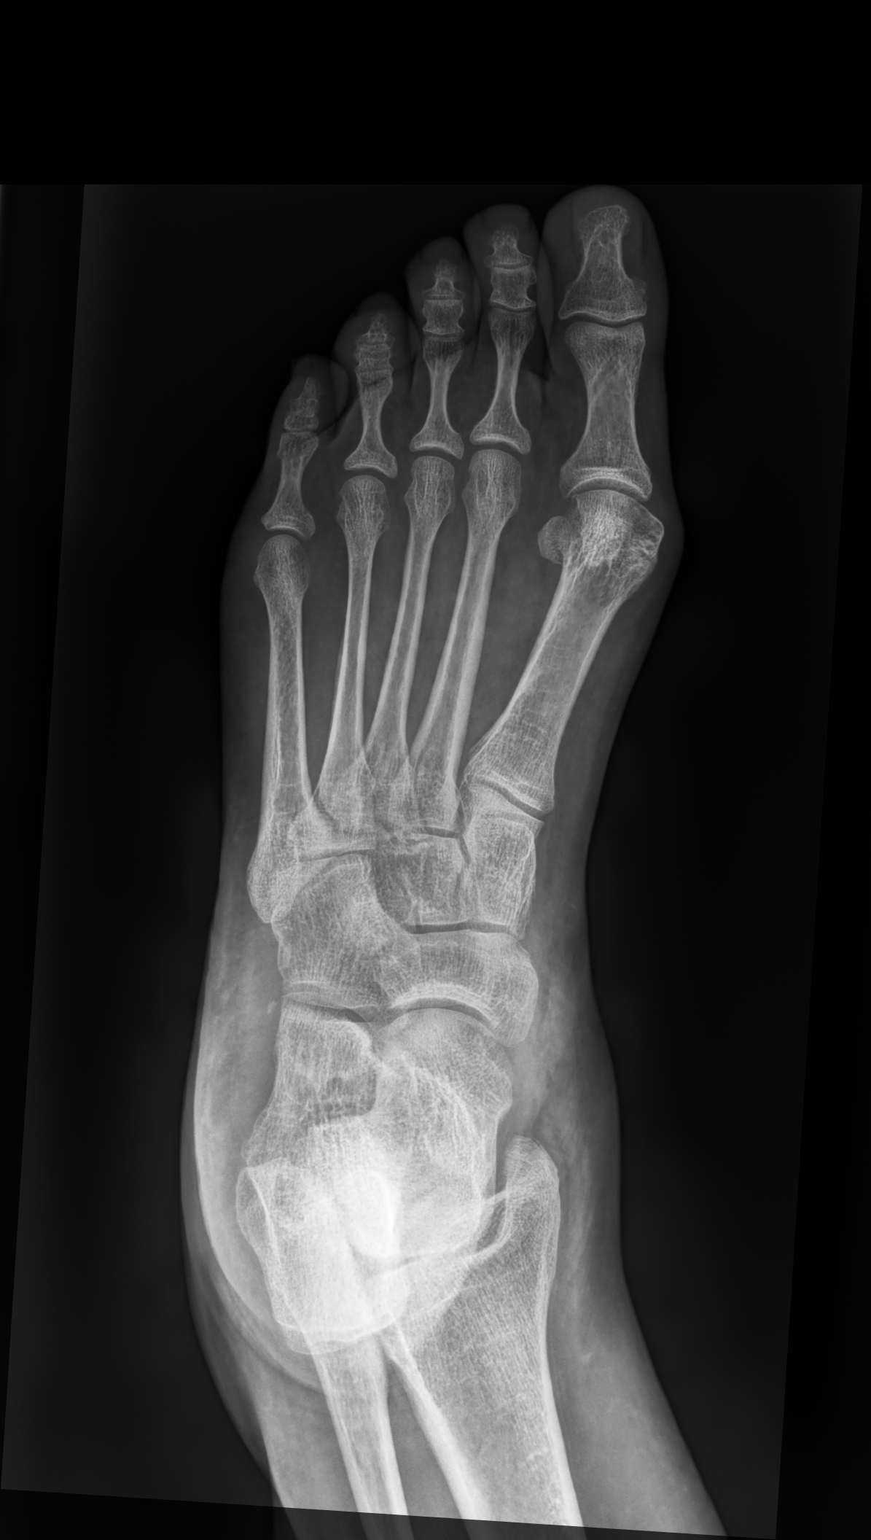

[foot medial oblique]
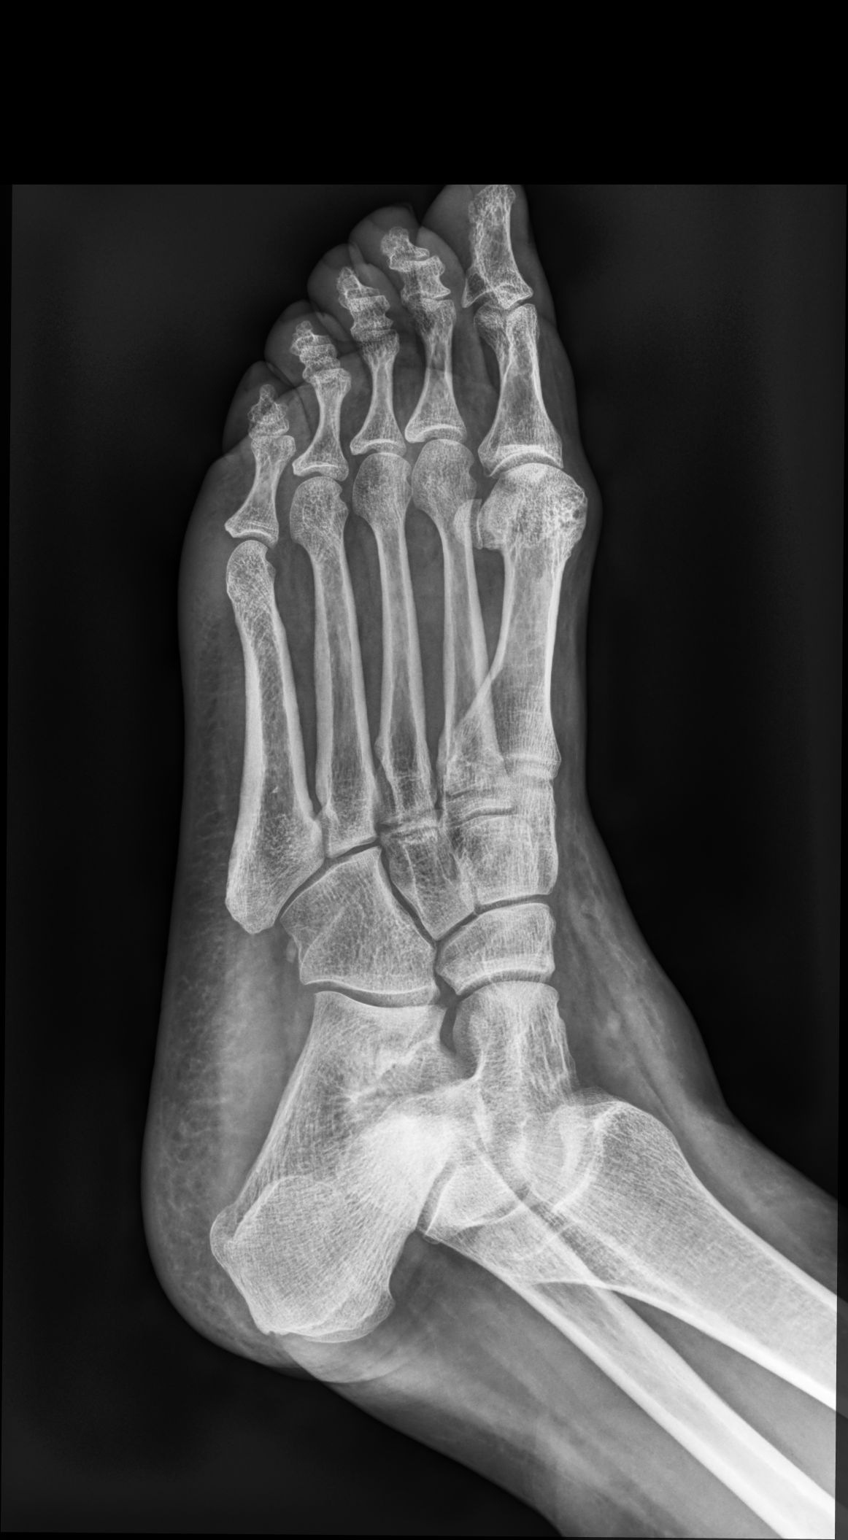

[foot supine lat]
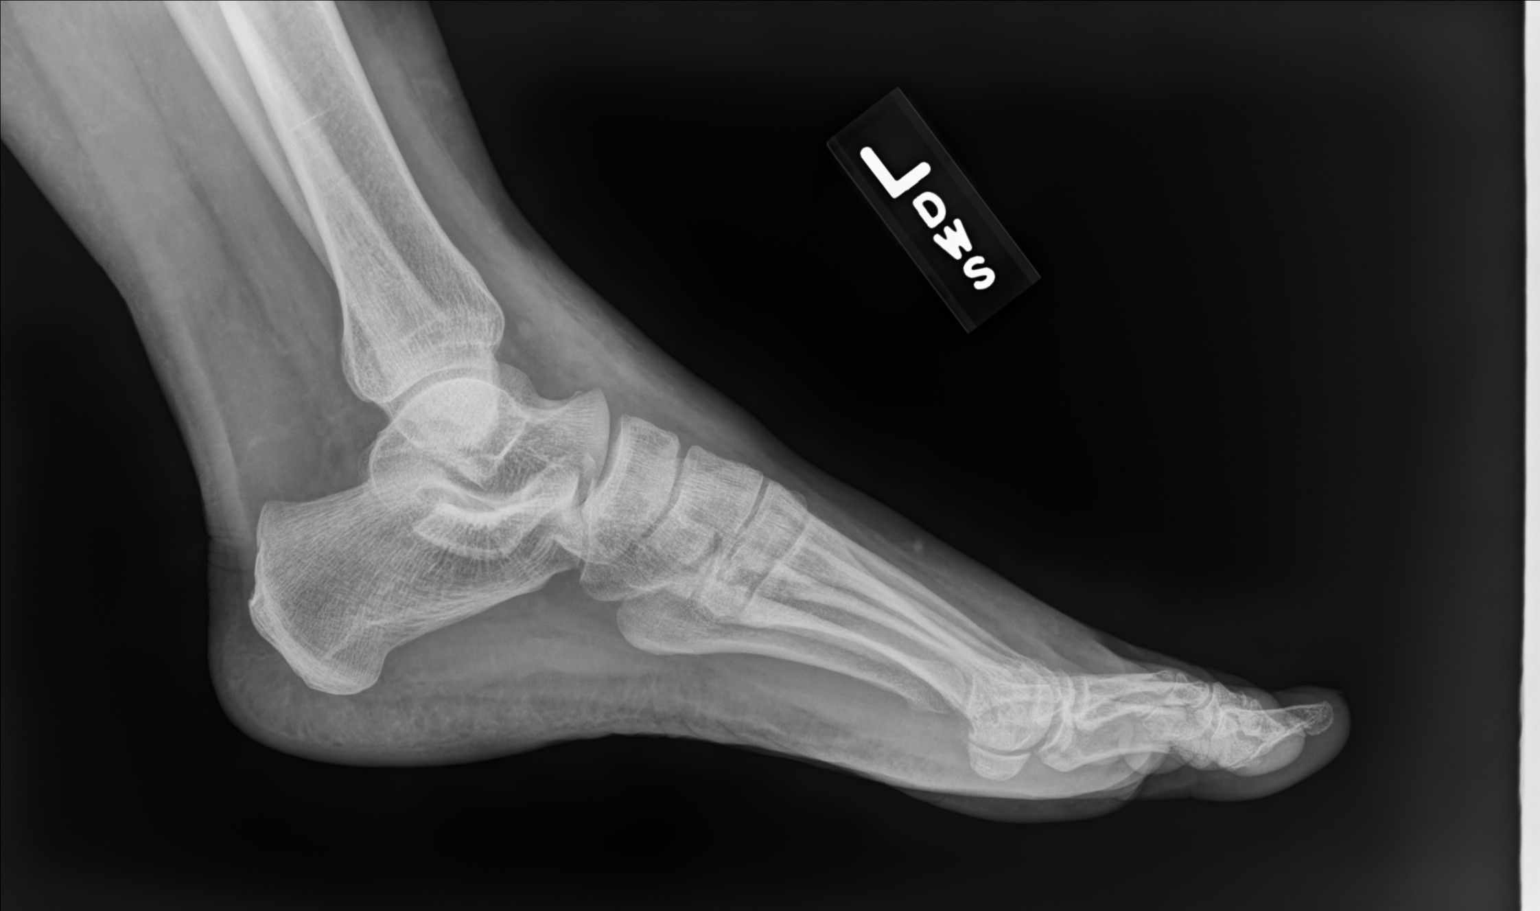

[3 of 3 positions shown; findings below may reference images not displayed]

FINDINGS: There is significant soft tissue swelling in the hindfoot. A small
bone density is identified along the anterior aspect of the
calcaneus, at the calcaneocuboid joint, suspicious for calcaneal
fracture.
IMPRESSION: Suspect calcaneal fracture. Consider further evaluation with CT
exam.

## 2023-06-11 ENCOUNTER — Encounter (HOSPITAL_COMMUNITY): Payer: Self-pay | Admitting: Emergency Medicine

## 2023-06-11 ENCOUNTER — Other Ambulatory Visit: Payer: Self-pay

## 2023-06-11 ENCOUNTER — Emergency Department (HOSPITAL_COMMUNITY)
Admission: EM | Admit: 2023-06-11 | Discharge: 2023-06-11 | Disposition: A | Discharge status: Home / Self Care | Attending: Emergency Medicine | Admitting: Emergency Medicine

## 2023-06-11 ENCOUNTER — Emergency Department (HOSPITAL_COMMUNITY)

## 2023-06-11 DIAGNOSIS — R0602 Shortness of breath: Secondary | ICD-10-CM | POA: Diagnosis present

## 2023-06-11 DIAGNOSIS — R0789 Other chest pain: Secondary | ICD-10-CM | POA: Insufficient documentation

## 2023-06-11 DIAGNOSIS — J45909 Unspecified asthma, uncomplicated: Secondary | ICD-10-CM | POA: Diagnosis not present

## 2023-06-11 DIAGNOSIS — J45901 Unspecified asthma with (acute) exacerbation: Secondary | ICD-10-CM

## 2023-06-11 LAB — BLOOD GAS, VENOUS
Acid-Base Excess: 0.9 mmol/L (ref 0.0–2.0)
Bicarbonate: 25.2 mmol/L (ref 20.0–28.0)
O2 Saturation: 89.1 %
Patient temperature: 37
pCO2, Ven: 38 mmHg — ABNORMAL LOW (ref 44–60)
pH, Ven: 7.43 (ref 7.25–7.43)
pO2, Ven: 51 mmHg — ABNORMAL HIGH (ref 32–45)

## 2023-06-11 LAB — CBC WITH DIFFERENTIAL/PLATELET
Abs Immature Granulocytes: 0.1 10*3/uL — ABNORMAL HIGH (ref 0.00–0.07)
Basophils Absolute: 0 10*3/uL (ref 0.0–0.1)
Basophils Relative: 0 %
Eosinophils Absolute: 0 10*3/uL (ref 0.0–0.5)
Eosinophils Relative: 0 %
HCT: 38.8 % (ref 36.0–46.0)
Hemoglobin: 12.2 g/dL (ref 12.0–15.0)
Immature Granulocytes: 1 %
Lymphocytes Relative: 7 %
Lymphs Abs: 0.6 10*3/uL — ABNORMAL LOW (ref 0.7–4.0)
MCH: 30.3 pg (ref 26.0–34.0)
MCHC: 31.4 g/dL (ref 30.0–36.0)
MCV: 96.5 fL (ref 80.0–100.0)
Monocytes Absolute: 0.5 10*3/uL (ref 0.1–1.0)
Monocytes Relative: 6 %
Neutro Abs: 7.8 10*3/uL — ABNORMAL HIGH (ref 1.7–7.7)
Neutrophils Relative %: 86 %
Platelets: 239 10*3/uL (ref 150–400)
RBC: 4.02 MIL/uL (ref 3.87–5.11)
RDW: 11.9 % (ref 11.5–15.5)
WBC: 9 10*3/uL (ref 4.0–10.5)
nRBC: 0 % (ref 0.0–0.2)

## 2023-06-11 LAB — BASIC METABOLIC PANEL WITH GFR
Anion gap: 8 (ref 5–15)
BUN: 12 mg/dL (ref 6–20)
CO2: 24 mmol/L (ref 22–32)
Calcium: 10.1 mg/dL (ref 8.9–10.3)
Chloride: 106 mmol/L (ref 98–111)
Creatinine, Ser: 1.04 mg/dL — ABNORMAL HIGH (ref 0.44–1.00)
GFR, Estimated: >60 mL/min (ref >60)
Glucose, Bld: 140 mg/dL — ABNORMAL HIGH (ref 70–99)
Potassium: 3.5 mmol/L (ref 3.5–5.1)
Sodium: 138 mmol/L (ref 135–145)

## 2023-06-11 LAB — RESP PANEL BY RT-PCR (RSV, FLU A&B, COVID)  RVPGX2
Influenza A by PCR: NEGATIVE
Influenza B by PCR: NEGATIVE
Resp Syncytial Virus by PCR: NEGATIVE
SARS Coronavirus 2 by RT PCR: NEGATIVE

## 2023-06-11 LAB — D-DIMER, QUANTITATIVE: D-Dimer, Quant: <0.27 ug{FEU}/mL (ref 0.00–0.50)

## 2023-06-11 LAB — TROPONIN I (HIGH SENSITIVITY): Troponin I (High Sensitivity): 5 ng/L (ref <18)

## 2023-06-11 MED ORDER — MAGNESIUM SULFATE 2 GM/50ML IV SOLN
2.0000 g | Freq: Once | INTRAVENOUS | Status: AC
  Administered 2023-06-11: 2 g via INTRAVENOUS
  Filled 2023-06-11: qty 50

## 2023-06-11 MED ORDER — SODIUM CHLORIDE 0.9 % IV BOLUS
1000.0000 mL | Freq: Once | INTRAVENOUS | Status: AC
  Administered 2023-06-11: 1000 mL via INTRAVENOUS

## 2023-06-11 MED ORDER — ALBUTEROL SULFATE HFA 108 (90 BASE) MCG/ACT IN AERS
2.0000 | INHALATION_SPRAY | RESPIRATORY_TRACT | Status: DC | PRN
  Administered 2023-06-11: 2 via RESPIRATORY_TRACT
  Filled 2023-06-11: qty 6.7

## 2023-06-11 NOTE — ED Triage Notes (Signed)
 Patient went to her MD yesterday for SOB and felling faint. She was given a script for prednisone  and took her first dose this morning. Even with the steroid, she has had to use her inhaler frequently. She reports feeling faint and lightheaded.

## 2023-06-11 NOTE — ED Provider Notes (Signed)
 [place] EMERGENCY DEPARTMENT AT [facility] Provider Note   CSN: [id number] Arrival date & time: 06/11/23  1940     History  Chief Complaint  Patient presents with   Shortness of Breath    Susan Berry is a 58 y.o. female.  58 year old female presents today for concern of shortness of breath.  She was seen by PCP yesterday and started on prednisone  Dosepak.  She states after taking it today she had to use her inhaler about 20 times.  She feels some chest tightness otherwise denies any wheezing or other complaints.  Denies prior history of PE.  The history is provided by the patient. No language interpreter was used.       Home Medications Prior to Admission medications   Medication Sig Start Date End Date Taking? Authorizing Provider  mirabegron ER (MYRBETRIQ) 50 MG TB24 tablet Take 50 mg by mouth daily.    [provider]  Multiple Vitamins-Minerals (EYE VITAMINS & MINERALS PO) Take 1 tablet by mouth daily.    [provider]  oxyCODONE  (ROXICODONE ) 5 MG immediate release tablet Take 1 tablet (5 mg total) by mouth every 6 (six) hours as needed for severe pain. 04/04/22   Caralyn Chandler, MD  vitamin C (ASCORBIC ACID) 500 MG tablet Take 500 mg by mouth daily.    [provider]      Allergies    Aspirin and Nsaids    Review of Systems   Review of Systems  Constitutional:  Negative for fever.  Respiratory:  Positive for shortness of breath. Negative for cough and wheezing.   Cardiovascular:  Negative for chest pain, palpitations and leg swelling.  All other systems reviewed and are negative.   Physical Exam Updated Vital Signs BP 126/68   Pulse (!) 108   Temp 98.3 F (36.8 C) (Oral)   Resp (!) 21   Ht 5\' 9"  (1.753 m)   Wt 88 kg   SpO2 99%   BMI 28.65 kg/m  Physical Exam Vitals and nursing note reviewed.  Constitutional:      General: She is not in acute distress.    Appearance: Normal appearance. She is not  ill-appearing.  HENT:     Head: Normocephalic and atraumatic.     Nose: Nose normal.  Eyes:     General: No scleral icterus.    Extraocular Movements: Extraocular movements intact.     Conjunctiva/sclera: Conjunctivae normal.  Cardiovascular:     Rate and Rhythm: Normal rate and regular rhythm.     Pulses: Normal pulses.     Heart sounds: Normal heart sounds.  Pulmonary:     Effort: Pulmonary effort is normal. No respiratory distress.     Breath sounds: Normal breath sounds. No wheezing or rales.  Abdominal:     General: There is no distension.     Tenderness: There is no abdominal tenderness.  Musculoskeletal:        General: Normal range of motion.     Cervical back: Normal range of motion.  Skin:    General: Skin is warm and dry.  Neurological:     General: No focal deficit present.     Mental Status: She is alert. Mental status is at baseline.     ED Results / Procedures / Treatments   Labs (all labs ordered are listed, but only abnormal results are displayed) Labs Reviewed  CBC WITH DIFFERENTIAL/PLATELET - Abnormal; Notable for the following components:  Result Value   Neutro Abs 7.8 (*)    Lymphs Abs 0.6 (*)    Abs Immature Granulocytes 0.10 (*)    All other components within normal limits  BASIC METABOLIC PANEL WITH GFR - Abnormal; Notable for the following components:   Glucose, Bld 140 (*)    Creatinine, Ser 1.04 (*)    All other components within normal limits  BLOOD GAS, VENOUS - Abnormal; Notable for the following components:   pCO2, Ven 38 (*)    pO2, Ven 51 (*)    All other components within normal limits  RESP PANEL BY RT-PCR (RSV, FLU A&B, COVID)  [id number]  D-DIMER, QUANTITATIVE  TROPONIN I (HIGH SENSITIVITY)  TROPONIN I (HIGH SENSITIVITY)    EKG EKG Interpretation Date/Time:  Thursday June 11 2023 19:51:09 EDT Ventricular Rate:  127 PR Interval:  145 QRS Duration:  69 QT Interval:  318 QTC Calculation: 463 R Axis:   14  Text  Interpretation: Sinus tachycardia Confirmed by Guadalupe Lee ([id number]) on 06/11/2023 8:45:11 PM  Radiology DG Chest 2 View Result Date: 06/11/2023 CLINICAL DATA:  Left-sided chest tightness and shortness of breath for 1 week. EXAM: CHEST - 2 VIEW COMPARISON:  June 15, 2014 FINDINGS: The heart size and mediastinal contours are within normal limits. A trace amount of linear scarring and/or atelectasis is seen within the left lung base. There is no evidence of acute infiltrate, pleural effusion or pneumothorax. The visualized skeletal structures are unremarkable. IMPRESSION: No active cardiopulmonary disease. Electronically Signed   By: Virgle Grime M.D.   On: 06/11/2023 20:48    Procedures Procedures    Medications Ordered in ED Medications  albuterol  (VENTOLIN  HFA) 108 (90 Base) MCG/ACT inhaler 2 puff (2 puffs Inhalation Given 06/11/23 2301)  magnesium  sulfate IVPB 2 g 50 mL (0 g Intravenous Stopped 06/11/23 2256)  sodium chloride  0.9 % bolus 1,000 mL (0 mLs Intravenous Stopped 06/11/23 2256)    ED Course/ Medical Decision Making/ A&P                                 Medical Decision Making Amount and/or Complexity of Data Reviewed Labs: ordered. Radiology: ordered.  Risk Prescription drug management.   Medical Decision Making / ED Course   This patient presents to the ED for concern of shortness of breath, this involves an extensive number of treatment options, and is a complaint that carries with it a high risk of complications and morbidity.  The differential diagnosis includes shortness of breath asthma exacerbation, COPD exacerbation, viral URI, ACS, PE, pneumonia  MDM: 58 year old female presents today for concern of shortness of breath.  She was seen by PCP yesterday and started on prednisone  Dosepak.  She states her chest tightness is continuing and she used her albuterol  inhaler 20 times. She is tachycardic: Eval likely due to excessive albuterol  use.  She started the  prednisone  Dosepak today. Endorses some chest tightness otherwise on exam she does not have any wheezing or other respiratory distress. CBC unremarkable, BMP with glucose 140, creatinine 1.04 otherwise without acute concern.  ABG without acute concern.  Troponin of 5, D-dimer within normal, respiratory panel negative.  Chest x-ray without acute cardiopulmonary process.  EKG without acute ischemic change. Low suspicion for ACS, no evidence of PE given negative D-dimer. Fluid bolus was given. .  Magnesium  was given .  On reevaluation patient reports significant improvement. Heart rate improved. Offered admission however she  declines and states she would like to go home and follow-up closely with her PCP .  Feel this is reasonable. I did ambulate patient within the room and she did not have any desaturation. Patient discharged in stable condition.  Return precaution discussed.   Lab Tests: -I ordered, reviewed, and interpreted labs.   The pertinent results include:   Labs Reviewed  CBC WITH DIFFERENTIAL/PLATELET - Abnormal; Notable for the following components:      Result Value   Neutro Abs 7.8 (*)    Lymphs Abs 0.6 (*)    Abs Immature Granulocytes 0.10 (*)    All other components within normal limits  BASIC METABOLIC PANEL WITH GFR - Abnormal; Notable for the following components:   Glucose, Bld 140 (*)    Creatinine, Ser 1.04 (*)    All other components within normal limits  BLOOD GAS, VENOUS - Abnormal; Notable for the following components:   pCO2, Ven 38 (*)    pO2, Ven 51 (*)    All other components within normal limits  RESP PANEL BY RT-PCR (RSV, FLU A&B, COVID)  [id number]  D-DIMER, QUANTITATIVE  TROPONIN I (HIGH SENSITIVITY)  TROPONIN I (HIGH SENSITIVITY)      EKG  EKG Interpretation Date/Time:  Thursday June 11 2023 19:51:09 EDT Ventricular Rate:  127 PR Interval:  145 QRS Duration:  69 QT Interval:  318 QTC Calculation: 463 R Axis:   14  Text  Interpretation: Sinus tachycardia Confirmed by Guadalupe Lee ([id number]) on 06/11/2023 8:45:11 PM         Imaging Studies ordered: I ordered imaging studies including chest x-ray I independently visualized and interpreted imaging. I agree with the radiologist interpretation   Medicines ordered and prescription drug management: Meds ordered this encounter  Medications   albuterol  (VENTOLIN  HFA) 108 (90 Base) MCG/ACT inhaler 2 puff   magnesium  sulfate IVPB 2 g 50 mL   sodium chloride  0.9 % bolus 1,000 mL    -I have reviewed the patients home medicines and have made adjustments as needed  Critical interventions Magnesium , IV fluids   Cardiac Monitoring: The patient was maintained on a cardiac monitor.  I personally viewed and interpreted the cardiac monitored which showed an underlying rhythm of: Sinus tachycardia improved to normal sinus rhythm  Social Determinants of Health:  Factors impacting patients care include: Good outpatient follow-up   Reevaluation: After the interventions noted above, I reevaluated the patient and found that they have :improved  Co morbidities that complicate the patient evaluation  Past Medical History:  Diagnosis Date   Asthma    last use of inhaler 5 yrs ago   Enlarged thyroid  gland    left side - f/u with md yearly   SVD (spontaneous vaginal delivery)    x 4   TIA (transient ischemic attack)       Dispostion: Discharged in stable condition.  Return precaution discussed.  Patient voices understanding and is in agreement with plan.   Final Clinical Impression(s) / ED Diagnoses Final diagnoses:  None    Rx / DC Orders ED Discharge Orders     None         Lucina Sabal, PA-C 06/11/23 2309    Guadalupe Lee, MD 06/12/23 [phone]

## 2023-06-11 NOTE — Discharge Instructions (Signed)
 Your workup today was overall reassuring.  Your symptoms did improve.  Follow-up with your primary care doctor.  We discussed admission however you declined and stated you would like to follow-up with your primary care doctor or return if you have any worsening symptoms.  I feel this is reasonable at this time. Blood work, chest x-ray did not show any concerning findings.  Continue with the prednisone  course that was prescribed by your primary care doctor.

## 2023-10-12 ENCOUNTER — Emergency Department (HOSPITAL_COMMUNITY)

## 2023-10-12 ENCOUNTER — Other Ambulatory Visit: Payer: Self-pay

## 2023-10-12 ENCOUNTER — Emergency Department (HOSPITAL_COMMUNITY)
Admission: EM | Admit: 2023-10-12 | Discharge: 2023-10-13 | Disposition: A | Discharge status: Home / Self Care | Attending: Emergency Medicine | Admitting: Emergency Medicine

## 2023-10-12 ENCOUNTER — Encounter (HOSPITAL_COMMUNITY): Payer: Self-pay | Admitting: Emergency Medicine

## 2023-10-12 DIAGNOSIS — M25522 Pain in left elbow: Secondary | ICD-10-CM | POA: Diagnosis present

## 2023-10-12 DIAGNOSIS — W19XXXA Unspecified fall, initial encounter: Secondary | ICD-10-CM

## 2023-10-12 DIAGNOSIS — M79602 Pain in left arm: Secondary | ICD-10-CM | POA: Insufficient documentation

## 2023-10-12 DIAGNOSIS — W01198A Fall on same level from slipping, tripping and stumbling with subsequent striking against other object, initial encounter: Secondary | ICD-10-CM | POA: Diagnosis not present

## 2023-10-12 MED ORDER — ACETAMINOPHEN 500 MG PO TABS
1000.0000 mg | ORAL_TABLET | Freq: Once | ORAL | Status: AC
  Administered 2023-10-12: 1000 mg via ORAL
  Filled 2023-10-12: qty 2

## 2023-10-12 NOTE — ED Triage Notes (Signed)
 Pt reports she tripped and fell. Hit her left arm & face on the concrete. Denies blood thinners.

## 2023-10-12 NOTE — ED Provider Notes (Signed)
 [place] EMERGENCY DEPARTMENT AT [facility] Provider Note   CSN: [id number] Arrival date & time: 10/12/23  2238     History Chief Complaint  Patient presents with   Fall    HPI Susan Berry is a 58 y.o. female presenting for left arm pain.  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  Denies fevers chills nausea vomiting shortness of breath.  Pain in her left elbow.  Neurovascularly intact.   Patient's recorded medical, surgical, social, medication list and allergies were reviewed in the Snapshot window as part of the initial history.   Review of Systems   Review of Systems  Constitutional:  Negative for chills and fever.  HENT:  Negative for ear pain and sore throat.   Eyes:  Negative for pain and visual disturbance.  Respiratory:  Negative for cough and shortness of breath.   Cardiovascular:  Negative for chest pain and palpitations.  Gastrointestinal:  Negative for abdominal pain and vomiting.  Genitourinary:  Negative for dysuria and hematuria.  Musculoskeletal:  Negative for arthralgias and back pain.  Skin:  Negative for color change and rash.  Neurological:  Negative for seizures and syncope.  All other systems reviewed and are negative.   Physical Exam Updated Vital Signs BP (!) 141/87 (BP Location: Right Arm)   Pulse 77   Temp 98.7 F (37.1 C) (Oral)   Resp 18   SpO2 100%  Physical Exam Vitals and nursing note reviewed.  Constitutional:      General: She is not in acute distress.    Appearance: She is well-developed.  HENT:     Head: Normocephalic and atraumatic.  Eyes:     Conjunctiva/sclera: Conjunctivae normal.  Cardiovascular:     Rate and Rhythm: Normal rate and regular rhythm.     Heart sounds: No murmur heard. Pulmonary:     Effort: Pulmonary effort is normal. No respiratory distress.     Breath sounds: Normal breath sounds.  Abdominal:     General: There is no distension.     Palpations: Abdomen is soft.      Tenderness: There is no abdominal tenderness. There is no right CVA tenderness or left CVA tenderness.  Musculoskeletal:        General: Tenderness and deformity present. No swelling. Normal range of motion.     Cervical back: Neck supple.  Skin:    General: Skin is warm and dry.  Neurological:     General: No focal deficit present.     Mental Status: She is alert and oriented to person, place, and time. Mental status is at baseline.     Cranial Nerves: No cranial nerve deficit.      ED Course/ Medical Decision Making/ A&P    Procedures Procedures   Medications Ordered in ED Medications  acetaminophen  (TYLENOL ) tablet 1,000 mg (1,000 mg Oral Given 10/12/23 2339)    Medical Decision Making:   58 year old female left elbow injury after fall.  X-ray shows no focal deformity or fracture.  Discussed possibility of subtle nondisplaced fracture possibility of need for follow-up with orthopedics for repeat imaging. Will place in sling for comfort recommend follow-up with orthopedics for reassessment.  Disposition:  I have considered need for hospitalization, however, considering all of the above, I believe this patient is stable for discharge at this time.  Patient/family educated about specific return precautions for given chief complaint and symptoms.  Patient/family educated about follow-up with PCP.  Patient/family expressed understanding of return precautions and need for follow-up. Patient spoken to regarding all imaging and laboratory results and appropriate follow up for these results. All education provided in verbal form with additional information in written form. Time was allowed for answering of patient questions. Patient discharged.    Emergency Department Medication Summary:   Medications  acetaminophen  (TYLENOL ) tablet 1,000 mg (1,000 mg Oral Given 10/12/23 2339)        Clinical Impression:  1. Fall, initial encounter      Discharge   Final Clinical  Impression(s) / ED Diagnoses Final diagnoses:  Fall, initial encounter    Rx / DC Orders ED Discharge Orders     Berry         Jerral Meth, MD 10/13/23 [phone]

## 2023-10-16 ENCOUNTER — Encounter (HOSPITAL_BASED_OUTPATIENT_CLINIC_OR_DEPARTMENT_OTHER): Payer: Self-pay | Admitting: Physician Assistant

## 2023-10-16 ENCOUNTER — Ambulatory Visit (INDEPENDENT_AMBULATORY_CARE_PROVIDER_SITE_OTHER): Admitting: Physician Assistant

## 2023-10-16 DIAGNOSIS — M79602 Pain in left arm: Secondary | ICD-10-CM | POA: Diagnosis not present

## 2023-10-16 NOTE — Progress Notes (Signed)
 Office Visit Note   Patient: Susan Berry           Date of Birth: [birth year]           MRN: [id number] Visit Date: 10/16/2023              Requested by: Pura Lenis, MD 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  She had pain in her her left forearm elbow and wrist.  She was seen in the emergency room on the 25th x-rays did not demonstrate any fractures.  She says it is a little bit better most of her pain is in the forearm.  She said at first she could not extend her elbow but now she is able to fully extend it  Assessment & Plan: Visit Diagnoses:  1. Left arm pain     Plan: No red flags today she is able to extend her elbow does reproduce a little bit of pain she is nontender to palpation she does have most of her soreness in the forearm especially with pronation.  She has good extension and flexion of her wrist.  X-rays taken at the emergency room do not show any significant effusion or fractures.  Would like for her to continue to work on range of motion especially of her elbow.  Would have her reevaluated in 2 weeks  Follow-Up Instructions: No follow-ups on file.   Ortho Exam  Patient is alert, oriented, no adenopathy, well-dressed, normal affect, normal respiratory effort. Examination of her arms she has neurovascular intact she has no ecchymosis a little bit of swelling in the forearm.  A little diffuse tenderness.  She has good flexion of her elbow extension she has however does reproduce some pain going down her forearm.  She has pain reproduced in the forearm again with pronation not as much with supination.  She has good grip strength    Imaging: No results  found. No images are attached to the encounter.  Labs: Lab Results  Component Value Date   HGBA1C  07/23/2007    4.8 (NOTE)   The ADA recommends the following therapeutic goals for glycemic   control related to Hgb A1C measurement:   Goal of Therapy:   < 7.0% Hgb A1C   Action Suggested:  > 8.0% Hgb A1C   Ref:  Diabetes Care, 22, Suppl. 1, 1999   ESRSEDRATE 7 02/22/2010   ESRSEDRATE 7 02/21/2010     Lab Results  Component Value Date   ALBUMIN 3.7 01/03/2019   ALBUMIN 3.9 04/14/2016   ALBUMIN 4.3 06/15/2014    Lab Results  Component Value Date   MG 2.3 07/25/2007   MG 1.8 07/23/2007   No results found for: VD25OH  No results found for: PREALBUMIN    Latest Ref Rng & Units 06/11/2023    9:41 PM 01/03/2019    9:37 AM 04/14/2016    8:25 PM  CBC EXTENDED  WBC 4.0 - 10.5 K/uL 9.0  5.9  7.5   RBC 3.87 - 5.11 MIL/uL 4.02  4.65  4.15   Hemoglobin 12.0 - 15.0 g/dL 87.7  86.1  87.6   HCT 36.0 -  46.0 % 38.8  44.7  37.7   Platelets 150 - 400 K/uL 239  251  260   NEUT# 1.7 - 7.7 K/uL 7.8  2.8    Lymph# 0.7 - 4.0 K/uL 0.6  2.6       There is no height or weight on file to calculate BMI.  Orders:  No orders of the defined types were placed in this encounter.  No orders of the defined types were placed in this encounter.    Procedures: No procedures performed  Clinical Data: No additional findings.  ROS:  All other systems negative, except as noted in the HPI. Review of Systems  Objective: Vital Signs: There were no vitals taken for this visit.  Specialty Comments:  No specialty comments available.  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 No pertinent family history.  Past Surgical History:  Procedure Laterality Date   ABLATION     BREAST BIOPSY Left 02/27/2022   US  LT BREAST BX W LOC DEV 1ST LESION IMG BX SPEC US  GUIDE 02/27/2022 [facility]   BREAST BIOPSY  04/03/2022   MM LT RADIOACTIVE SEED LOC MAMMO GUIDE 04/03/2022 [facility]   BREAST CYST EXCISION Left 30 yrs ago   left   BREAST LUMPECTOMY WITH RADIOACTIVE SEED LOCALIZATION Left 04/04/2022   Procedure: LEFT BREAST LUMPECTOMY WITH RADIOACTIVE SEED LOCALIZATION;  Surgeon: Curvin Deward MOULD, MD;  Location: [facility];  Service: General;  Laterality: Left;   EYE SURGERY     left eye surgery   HYSTEROSCOPY WITH D & C N/A 11/24/2012   Procedure: DILATATION AND CURETTAGE /HYSTEROSCOPY;  Surgeon: Aida DELENA Na, MD;  Location: [facility];  Service: Gynecology;  Laterality: N/A;   repair of vaginal tear   1986   after SVD   TUBAL LIGATION     Social History   Occupational History   Not on file  Tobacco Use   Smoking status: Never   Smokeless tobacco: Never  Substance and Sexual Activity   Alcohol use: No   Drug use: No   Sexual activity: Yes    Birth control/protection: Surgical

## 2023-10-30 ENCOUNTER — Ambulatory Visit (HOSPITAL_BASED_OUTPATIENT_CLINIC_OR_DEPARTMENT_OTHER)

## 2023-10-30 ENCOUNTER — Ambulatory Visit (HOSPITAL_BASED_OUTPATIENT_CLINIC_OR_DEPARTMENT_OTHER): Admitting: Student

## 2023-10-30 DIAGNOSIS — M25532 Pain in left wrist: Secondary | ICD-10-CM | POA: Diagnosis not present

## 2023-10-30 DIAGNOSIS — M79602 Pain in left arm: Secondary | ICD-10-CM

## 2023-10-30 DIAGNOSIS — S52125A Nondisplaced fracture of head of left radius, initial encounter for closed fracture: Secondary | ICD-10-CM

## 2023-10-30 NOTE — Progress Notes (Signed)
 Chief Complaint: Left arm pain     History of Present Illness:    Susan Berry is a 58 y.o. female who presents today following up on left arm pain as a result of a fall on 8/25.  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  Initial x-rays of the left elbow and forearm appeared negative for fracture.  She continues to experience pain along the wrist, forearm, and elbow that she rates as moderate in severity.  Her range of motion has shown some improvement.  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  He is taking Tylenol  consistently.   Surgical History:   None  PMH/PSH/Family History/Social History/Meds/Allergies:    Past Medical History:  Diagnosis Date   Asthma    last use of inhaler 5 yrs ago   Enlarged thyroid  gland    left side - f/u with md yearly   SVD (spontaneous vaginal delivery)    x 4   TIA (transient ischemic attack)    Past Surgical History:  Procedure Laterality Date   ABLATION     BREAST BIOPSY Left 02/27/2022   US  LT BREAST BX W LOC DEV 1ST LESION IMG BX SPEC US  GUIDE 02/27/2022 [facility]   BREAST BIOPSY  04/03/2022   MM LT RADIOACTIVE SEED LOC MAMMO GUIDE 04/03/2022 [facility]   BREAST CYST EXCISION Left 30 yrs ago   left   BREAST LUMPECTOMY WITH RADIOACTIVE SEED LOCALIZATION Left 04/04/2022   Procedure: LEFT BREAST LUMPECTOMY WITH RADIOACTIVE SEED LOCALIZATION;  Surgeon: Curvin Deward MOULD, MD;  Location: [facility];  Service: General;  Laterality: Left;   EYE SURGERY     left eye surgery   HYSTEROSCOPY WITH D & C N/A 11/24/2012   Procedure: DILATATION AND CURETTAGE /HYSTEROSCOPY;  Surgeon: Aida DELENA Na, MD;  Location: [facility];  Service: Gynecology;  Laterality: N/A;   repair of vaginal tear   1986   after SVD   TUBAL LIGATION     Social History   Socioeconomic  History   Marital status: Single    Spouse name: Not on file   Number of children: Not on file   Years of education: Not on file   Highest education level: Not on file  Occupational History   Not on file  Tobacco Use   Smoking status: Never   Smokeless tobacco: Never  Substance and Sexual Activity   Alcohol use: No   Drug use: No   Sexual activity: Yes    Birth control/protection: Surgical  Other Topics Concern   Not on file  Social History Narrative   Not on file   Social Drivers of Health   Financial Resource Strain: Low Risk  (09/07/2023)   Received from [facility]   Overall Financial Resource Strain (CARDIA)    How hard is it for you to pay for the very basics like food, housing, medical care, and heating?: Not hard at all  Food Insecurity: No Food Insecurity (09/07/2023)   Received from [facility]   Hunger Vital Sign    Within the past 12 months, you worried that your food would run out before you got the money to buy more.: Never  true    Within the past 12 months, the food you bought just didn't last and you didn't have money to get more.: Never true  Transportation Needs: No Transportation Needs (09/07/2023)   Received from [facility] - Transportation    In the past 12 months, has lack of transportation kept you from medical appointments or from getting medications?: No    In the past 12 months, has lack of transportation kept you from meetings, work, or from getting things needed for daily living?: No  Physical Activity: Insufficiently Active (09/07/2023)   Received from [facility]   Exercise Vital Sign    On average, how many days per week do you engage in moderate to strenuous exercise (like a brisk walk)?: 1 day    On average, how many minutes do you engage in exercise at this level?: 20 min  Stress: No Stress Concern Present (09/07/2023)   Received from [facility] - Occupational Stress Questionnaire     Do you feel stress - tense, restless, nervous, or anxious, or unable to sleep at night because your mind is troubled all the time - these days?: Not at all  Social Connections: Moderately Integrated (09/07/2023)   Received from [facility]   Social Network    How would you rate your social network (family, work, friends)?: Adequate participation with social networks   No family history on file. Allergies  Allergen Reactions   Aspirin Other (See Comments)    Causes asthma and bronchitis flare ups Does not take other NSAIDs either   Nsaids     Does not take due to allergy to aspirin.   Current Outpatient Medications  Medication Sig Dispense Refill   mirabegron ER (MYRBETRIQ) 50 MG TB24 tablet Take 50 mg by mouth daily.     Multiple Vitamins-Minerals (EYE VITAMINS & MINERALS PO) Take 1 tablet by mouth daily.     oxyCODONE  (ROXICODONE ) 5 MG immediate release tablet Take 1 tablet (5 mg total) by mouth every 6 (six) hours as needed for severe pain. 10 tablet 0   vitamin C (ASCORBIC ACID) 500 MG tablet Take 500 mg by mouth daily.     No current facility-administered medications for this visit.   No results found.  Review of Systems:   A ROS was performed including pertinent positives and negatives as documented in the HPI.  Physical Exam :   Constitutional: NAD and appears stated age Neurological: Alert and oriented Psych: Appropriate affect and cooperative There were no vitals taken for this visit.   Comprehensive Musculoskeletal Exam:    Patient demonstrates active left elbow range of motion from 0 to 120 degrees.  There is tenderness to palpation over the radial head.  Able to actively pronate and supinate without block to motion however this does cause discomfort.  Tenderness along the distal radius without palpable deformity and no snuffbox or ulnar-sided tenderness.  Discomfort noted in wrist extension.  Imaging:   Xray (left wrist 4 views, left elbow 3 views): No  evidence of acute fracture or bony injury within the left wrist, which does show positive ulnar variance.  There does appear to be a nondisplaced radial head fracture that was not visible on prior radiographs on 8/25.   I personally reviewed and interpreted the radiographs.   Assessment:   58 y.o. female with continued pain at the left wrist and elbow as result of a fall approximately 3 weeks ago.  Dedicated  x-rays today of the wrist do not appear to show any evidence of fracture, suggesting a likely sprain mechanism.  I did also repeat x-rays of the elbow which do appear to show a nondisplaced distal radius fracture.  Given that she is 3 weeks out I want her to continue with range of motion as tolerated, and we will send referral to occupational therapy to help progress this.  Wrist brace provided today for comfort.  I would like to see her back in 4 weeks and will take new elbow x-rays at that time.  Plan :    - Referral to Occupational Therapy for radial head fracture and return to clinic in 4 weeks     I personally saw and evaluated the patient, and participated in the management and treatment plan.  Leonce Reveal, PA-C Orthopedics

## 2023-11-09 NOTE — Therapy (Addendum)
 OUTPATIENT OCCUPATIONAL THERAPY ORTHO EVALUATION & DISCHARGE  Patient Name: Susan Berry MRN: [id number] DOB:[birth year], 58 y.o., female Today's Date: 11/10/2023  PCP: Pura CORDOBA MD REFERRING PROVIDER:  Emiliano Leonce CROME, PA-C                       OCCUPATIONAL THERAPY DISCHARGE SUMMARY  Visits from Start of Care: 1  Patient no-showed her first 2 treatments sessions consecutively, and is discharged per policy.  She was contacted and states that she feels fine and that she is going on a cruise and does not need any therapy.  She can report back to her provider with any future needs.  Goals could not be addressed obviously.   Melvenia Ada, OTR/L, CHT 12/04/23           END OF SESSION:  OT End of Session - 11/10/23 1439     Visit Number 1    Number of Visits 7    Date for Recertification  12/25/23    Authorization Type [employer]    OT Start Time 1439    OT Stop Time 1525    OT Time Calculation (min) 46 min    Equipment Utilized During Treatment compressive sleeve    Activity Tolerance Patient tolerated treatment well;Patient limited by fatigue;Patient limited by pain    Behavior During Therapy WFL for tasks assessed/performed          Past Medical History:  Diagnosis Date   Asthma    last use of inhaler 5 yrs ago   Enlarged thyroid  gland    left side - f/u with md yearly   SVD (spontaneous vaginal delivery)    x 4   TIA (transient ischemic attack)    Past Surgical History:  Procedure Laterality Date   ABLATION     BREAST BIOPSY Left 02/27/2022   US  LT BREAST BX W LOC DEV 1ST LESION IMG BX SPEC US  GUIDE 02/27/2022 [facility]   BREAST BIOPSY  04/03/2022   MM LT RADIOACTIVE SEED LOC MAMMO GUIDE 04/03/2022 [facility]   BREAST CYST EXCISION Left 30 yrs ago   left   BREAST LUMPECTOMY WITH RADIOACTIVE SEED LOCALIZATION Left 04/04/2022   Procedure: LEFT BREAST LUMPECTOMY WITH RADIOACTIVE SEED LOCALIZATION;  Surgeon: Curvin Deward MOULD, MD;  Location: [facility];  Service: General;  Laterality: Left;   EYE SURGERY     left eye surgery   HYSTEROSCOPY WITH D & C N/A 11/24/2012   Procedure: DILATATION AND CURETTAGE /HYSTEROSCOPY;  Surgeon: Aida DELENA Na, MD;  Location: [facility];  Service: Gynecology;  Laterality: N/A;   repair of vaginal tear   1986   after SVD   TUBAL LIGATION     Patient Active Problem List   Diagnosis Date Noted   Left arm pain 10/16/2023   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   Goiter 02/26/2010   LEG EDEMA, BILATERAL 09/04/2009   ANEMIA, MILD, HX OF 09/04/2009   PELVIC PAIN, LEFT 05/25/2008   AMENORRHEA 04/14/2008   Backache 04/14/2008   OTHER ANXIETY STATES 11/18/2007   CHEST PAIN 11/18/2007   HYPERLIPIDEMIA 08/09/2007   SOB 08/09/2007    ONSET DATE: DOI 10/12/23  REFERRING DIAG: D47.874J (ICD-10-CM) - Closed nondisplaced fracture of head of left radius, initial encounter   THERAPY DIAG:  Localized edema  Muscle weakness (generalized)  Other lack of coordination  Pain in left elbow  Stiffness of left elbow, not elsewhere classified  Rationale for Evaluation  and Treatment: Rehabilitation  SUBJECTIVE:   SUBJECTIVE STATEMENT: Now 4 weeks s/p Lt radial head fx, after a fall on her left dominant arm.  She states having some pain with trying to extend her arm at the elbow.  She was also having some wrist pain but was given a prefabricated wrist brace and after wearing it for a week her pain is much better.  She will continue to wear it at night as needed. She is a Energy manager and LHD.        PERTINENT HISTORY: Xray (left wrist 4 views, left elbow 3 views): No evidence of acute fracture or bony injury within the left wrist, which does show positive ulnar variance.  There does appear to be a nondisplaced radial head fracture that was not visible on prior radiographs on 8/25.   PRECAUTIONS: None  RED FLAGS: None   WEIGHT BEARING  RESTRICTIONS: Yes recommended less than 10 pounds or anything that hurts in the left arm and elbow now and for the next 3 to 4 weeks.  PAIN:  Are you having pain? Yes: NPRS scale: 0/10, up to a 3/10 when extending her elbow Pain location: Lt elbow  Pain description: Slightly aching, sometimes more sore Aggravating factors: Lifting Relieving factors: Rest  FALLS: Has patient fallen in last 6 months? Yes. Number of falls 1 (this accident, not a fal risk)   PLOF: Independent  PATIENT GOALS: To improve pain, strength, mobility in the left dominant arm  NEXT MD VISIT: As needed   OBJECTIVE: (All objective assessments below are from initial evaluation on: 11/10/23 unless otherwise specified.)   HAND DOMINANCE: LEFT  ADLs: Overall ADLs: States decreased ability to grab, hold household objects, pain and difficulty to open containers, perform FMS tasks (manipulate fasteners on clothing), mild to moderate bathing problems as well.    FUNCTIONAL OUTCOME MEASURES: Eval: Patient Specific Functional Scale: 3.3 (typing, mopping, lifting heavy pans)  (Higher Score  =  Better Ability for the Selected Tasks)      UPPER EXTREMITY ROM     Shoulder to Wrist AROM Left eval  Shoulder flexion   Shoulder abduction   Shoulder extension   Shoulder internal rotation   Shoulder external rotation   Elbow flexion 153  Elbow extension (-8)  Forearm supination 52  Forearm pronation  85  Wrist flexion 73  Wrist extension 60  Wrist ulnar deviation   Wrist radial deviation   (Blank rows = not tested)   Hand AROM Left eval  Full Fist Ability (or Gap to Distal Palmar Crease) WNL  Thumb Opposition  (Kapandji Scale)  WNL  (Blank rows = not tested)   UPPER EXTREMITY MMT:    Eval:  NT at eval due to recent and still healing injuries. Will be tested when appropriate.   MMT Left TBD  Shoulder flexion   Shoulder abduction   Shoulder adduction   Shoulder extension   Shoulder internal rotation    Shoulder external rotation   Middle trapezius   Lower trapezius   Elbow flexion   Elbow extension   Forearm supination   Forearm pronation   Wrist flexion   Wrist extension   Wrist ulnar deviation   Wrist radial deviation   (Blank rows = not tested)  HAND FUNCTION: Eval: Observed weakness in affected Lt hand.  Grip strength Right: 62 lbs, Left: 35 lbs   COORDINATION: Eval: Observed mild coordination impairments with affected Lt hand. Box and Blocks Test: 57 Blocks today (56 is WFL,  74 is AVG)  SENSATION: Eval:  Light touch intact today   EDEMA:   Eval: None significant today  COGNITION: Eval: Overall cognitive status: WFL for evaluation today   OBSERVATIONS:   Eval: Radial head area still slightly tender, only stiff at end ranges, TFCC area of the wrist not overly tender today though the wrist is slightly tight in extension.  No overt signs of thumb CMC arthritis and no complaints of that.   TODAY'S TREATMENT:  Post-evaluation treatment:   She was given the following home exercise program to perform in a pain-free fashion 3-4 times a day.  Each 1 was reviewed with her to ensure they were not very painful.  She should start with moist heat for 5 minutes and then do some self massage which will also help.  For self-care/safety she was advised to do no painful or heavy lifting pushing or pulling with the left arm for the next 3 to 4 weeks at least.  It was explained to her that bones do take 8 weeks to heal typically.  For her wrist discomfort, she can continue to wear her prefabricated brace as needed day/night, but should avoid wearing it unnecessarily to prevent stiffness.  She states understanding all directions and leaves in no significant pain    Exercises - Triceps Stretch- Do on back of chair   - 3-4 x daily - 3-5 reps - 15 hold - elbow flexion STRETCH  - 3-4 x daily - 3-5 reps - 15 sec hold - Seated Elbow PROM Blocked Extension  - 3-4 x daily - 3 reps - 20 sec  hold - Fridge Door Stretch  - 4 x daily - 3-5 reps - 15 sec hold - Forearm Supination Stretch  - 3-4 x daily - 3-5 reps - 15 sec hold - Wrist Prayer Stretch  - 4 x daily - 3-5 reps - 15 sec hold     PATIENT EDUCATION: Education details: See tx section above for details  Person educated: Patient Education method: Verbal Instruction, Teach back, Handouts  Education comprehension: States and demonstrates understanding, Additional Education required    HOME EXERCISE PROGRAM: Access Code: [email or web address] URL: [email or web address] Date: 11/10/2023 Prepared by: Melvenia Ada   GOALS: Goals reviewed with patient? Yes   SHORT TERM GOALS: (STG required if POC>30 days) Target Date: 11/27/23  Pt will demo/state understanding of initial HEP to improve pain levels and prerequisite motion. Goal status: INITIAL   LONG TERM GOALS: Target Date: 12/25/23  Pt will improve functional ability by decreased impairment per PSFS assessment from 3.3 to 6.5 or better, for better quality of life. Goal status: INITIAL  2.  Pt will improve grip strength in left hand from 35 lbs to at least 50 lbs for functional use at home and in IADLs. Goal status: INITIAL  3.  Pt will improve A/ROM in left elbow extension from -8 degrees to at least neutral/straight, to have functional motion for tasks like reach and grasp.  Goal status: INITIAL  4.  Pt will improve strength in left elbow flexion/extension from unsafe to test and apparent 3/5 MMT to at least 4+/5 MMT to have increased functional ability to carry out selfcare and higher-level homecare tasks with less difficulty. Goal status: INITIAL  5.  Pt will improve coordination skills in left hand and arm, as seen by improved score on box and blocks testing (at least 65 blocks ) to have increased functional ability to carry out fine motor tasks (fasteners, etc.) and more complex,  coordinated IADLs (meal prep, sports, etc.).  Goal status:  INITIAL  6.  Pt will decrease pain at worst from 3/10 to 1/10 or better to have better sleep and occupational participation in daily roles. Goal status: INITIAL   ASSESSMENT:  CLINICAL IMPRESSION: Patient is a 58 y.o. female who was seen today for occupational therapy evaluation for stiffness, weakness, decreased movement and functional ability in the left left elbow and wrist after a fall and subsequent elbow fracture.  The patient will benefit from outpatient occupational therapy to decrease symptoms, improve functional upper extremity use, and increase quality of life.  PERFORMANCE DEFICITS: in functional skills including IADLs, ROM, strength, pain, fascial restrictions, Gross motor control, body mechanics, endurance, and UE functional use, cognitive skills including problem solving and safety awareness, and psychosocial skills including coping strategies, environmental adaptation, habits, and routines and behaviors.   IMPAIRMENTS: are limiting patient from ADLs, IADLs, rest and sleep, and leisure.   COMORBIDITIES: may have co-morbidities  that affects occupational performance. Patient will benefit from skilled OT to address above impairments and improve overall function.  MODIFICATION OR ASSISTANCE TO COMPLETE EVALUATION: No modification of tasks or assist necessary to complete an evaluation.  OT OCCUPATIONAL PROFILE AND HISTORY: Detailed assessment: Review of records and additional review of physical, cognitive, psychosocial history related to current functional performance.  CLINICAL DECISION MAKING: LOW - limited treatment options, no task modification necessary  REHAB POTENTIAL: Excellent  EVALUATION COMPLEXITY: Low      PLAN:  OT FREQUENCY: 1-2x/week  OT DURATION: 6 weeks through 12/25/2023 and up to 7 total visits as needed   PLANNED INTERVENTIONS: 97535 self care/ADL training, 02889 therapeutic exercise, 97530 therapeutic activity, 97112 neuromuscular re-education,  97140 manual therapy, 97035 ultrasound, 97032 electrical stimulation (manual), 97760 Orthotic Initial, [id number] Orthotic/Prosthetic subsequent, compression bandaging, Dry needling, energy conservation, coping strategies training, and patient/family education  RECOMMENDED OTHER SERVICES: none now    CONSULTED AND AGREED WITH PLAN OF CARE: Patient  PLAN FOR NEXT SESSION:   Review initial HEP and recommendations, try to upgrade stretches as able and an additional 2 to 3 weeks, start light grip and isometric elbow strength training.   Melvenia Ada, OTR/L, CHT  11/10/2023, 3:36 PM

## 2023-11-10 ENCOUNTER — Ambulatory Visit (INDEPENDENT_AMBULATORY_CARE_PROVIDER_SITE_OTHER): Admitting: Rehabilitative and Restorative Service Providers"

## 2023-11-10 ENCOUNTER — Encounter: Payer: Self-pay | Admitting: Rehabilitative and Restorative Service Providers"

## 2023-11-10 DIAGNOSIS — M25522 Pain in left elbow: Secondary | ICD-10-CM | POA: Diagnosis not present

## 2023-11-10 DIAGNOSIS — M6281 Muscle weakness (generalized): Secondary | ICD-10-CM | POA: Diagnosis not present

## 2023-11-10 DIAGNOSIS — M25622 Stiffness of left elbow, not elsewhere classified: Secondary | ICD-10-CM

## 2023-11-10 DIAGNOSIS — R278 Other lack of coordination: Secondary | ICD-10-CM | POA: Diagnosis not present

## 2023-11-10 DIAGNOSIS — R600 Localized edema: Secondary | ICD-10-CM

## 2023-11-25 NOTE — Therapy (Incomplete)
 OUTPATIENT OCCUPATIONAL THERAPY TREATMENT NOTE  Patient Name: Susan Berry MRN: [id number] DOB:Aug 02, 1965, 58 y.o., female Today's Date: 11/26/2023  PCP: Pura CORDOBA MD REFERRING PROVIDER:  Emiliano Leonce CROME, PA-C    END OF SESSION:    Past Medical History:  Diagnosis Date   Asthma    last use of inhaler 5 yrs ago   Enlarged thyroid  gland    left side - f/u with md yearly   SVD (spontaneous vaginal delivery)    x 4   TIA (transient ischemic attack)    Past Surgical History:  Procedure Laterality Date   ABLATION     BREAST BIOPSY Left 02/27/2022   US  LT BREAST BX W LOC DEV 1ST LESION IMG BX SPEC US  GUIDE 02/27/2022 [facility]   BREAST BIOPSY  04/03/2022   MM LT RADIOACTIVE SEED LOC MAMMO GUIDE 04/03/2022 [facility]   BREAST CYST EXCISION Left 30 yrs ago   left   BREAST LUMPECTOMY WITH RADIOACTIVE SEED LOCALIZATION Left 04/04/2022   Procedure: LEFT BREAST LUMPECTOMY WITH RADIOACTIVE SEED LOCALIZATION;  Surgeon: Curvin Deward MOULD, MD;  Location: [facility];  Service: General;  Laterality: Left;   EYE SURGERY     left eye surgery   HYSTEROSCOPY WITH D & C N/A 11/24/2012   Procedure: DILATATION AND CURETTAGE /HYSTEROSCOPY;  Surgeon: Aida DELENA Na, MD;  Location: [facility];  Service: Gynecology;  Laterality: N/A;   repair of vaginal tear   1986   after SVD   TUBAL LIGATION     Patient Active Problem List   Diagnosis Date Noted   Left arm pain 10/16/2023   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  Rationale for Evaluation and Treatment: Rehabilitation  PERTINENT HISTORY: Xray (left wrist 4 views, left elbow 3 views): No evidence of acute fracture or bony injury within the left wrist, which does show positive ulnar variance.  There does appear to be a nondisplaced radial head fracture that was not visible on prior radiographs on 8/25.   She states having some pain with trying to extend her arm at the elbow.  She was also having some wrist pain but was given a prefabricated wrist brace and after wearing it for a week her pain is much better.  She will continue to wear it at night as needed. She is a Energy manager and LHD.     PRECAUTIONS: None  RED FLAGS: None   WEIGHT BEARING RESTRICTIONS: Yes recommended less than 10 pounds or anything that hurts in the left arm and elbow now and for the next 3 to 4 weeks.   SUBJECTIVE:   SUBJECTIVE STATEMENT: Now 6 weeks s/p Lt radial head fx, after a fall on her left dominant arm. She states ***.       PAIN:  Are you having pain? Yes: NPRS scale: *** 0/10, up to a 3/10 when extending her elbow Pain location: Lt elbow  Pain description: Slightly aching, sometimes more sore Aggravating factors: Lifting Relieving factors: Rest   PATIENT  GOALS: To improve pain, strength, mobility in the left dominant arm  NEXT MD VISIT: As needed   OBJECTIVE: (All objective assessments below are from initial evaluation on: 11/10/23 unless otherwise specified.)   HAND DOMINANCE: LEFT  ADLs: Overall ADLs: States decreased ability to grab, hold household objects, pain and difficulty to open containers, perform FMS tasks (manipulate fasteners on clothing), mild to moderate bathing problems as well.    FUNCTIONAL OUTCOME MEASURES: Eval: Patient Specific Functional Scale: 3.3 (typing, mopping, lifting heavy pans)  (Higher Score  =  Better Ability for the Selected Tasks)      UPPER EXTREMITY ROM     Shoulder to Wrist AROM Left eval  Lt 11/27/23  Shoulder flexion    Shoulder abduction    Shoulder extension    Shoulder internal rotation    Shoulder external rotation    Elbow flexion 153 ***  Elbow extension (-8) ***  Forearm supination 52 ***  Forearm pronation  85 ***  Wrist flexion 73 ***  Wrist extension 60 ***  Wrist ulnar deviation    Wrist radial deviation    (Blank rows = not tested)   Hand AROM Left eval  Full Fist Ability (or Gap to Distal Palmar Crease) WNL  Thumb Opposition  (Kapandji Scale)  WNL  (Blank rows = not tested)   UPPER EXTREMITY MMT:    Eval:  NT at eval due to recent and still healing injuries. Will be tested when appropriate.   MMT Left TBD  Shoulder flexion   Shoulder abduction   Shoulder adduction   Shoulder extension   Shoulder internal rotation   Shoulder external rotation   Middle trapezius   Lower trapezius   Elbow flexion   Elbow extension   Forearm supination   Forearm pronation   Wrist flexion   Wrist extension   Wrist ulnar deviation   Wrist radial deviation   (Blank rows = not tested)  HAND FUNCTION: Eval: Observed weakness in affected Lt hand.  Grip strength Right: 62 lbs, Left: 35 lbs   COORDINATION: Eval: Observed mild coordination impairments with affected Lt hand. Box and Blocks Test: 57 Blocks today (56 is WFL, 74 is AVG)  OBSERVATIONS:   Eval: Radial head area still slightly tender, only stiff at end ranges, TFCC area of the wrist not overly tender today though the wrist is slightly tight in extension.  No overt signs of thumb CMC arthritis and no complaints of that.   TODAY'S TREATMENT:  11/27/23: She starts with active range of motion for exercise as well as new measures which shows ***.  OT then puts moist heat around her elbow while we review her home exercise program.  After manual therapy IASTM and massage to the tight adjacent muscle groups, we then perform her stretches together, her tolerating them fairly well.  OT also does light  manual therapy joint mobilizations at the radial head and forearm to encourage motion.  At the end of the session she states understanding all directions and exercise program and has no significant pain as she leaves. ***    Exercises Reviewed/performed today: - Triceps Stretch- Do on back of chair   - 3-4 x daily - 3-5 reps - 15 hold - elbow flexion STRETCH  - 3-4 x daily - 3-5 reps - 15 sec hold - Seated Elbow PROM Blocked Extension  - 3-4 x daily - 3 reps - 20 sec hold - Fridge Door Stretch  - 4 x daily - 3-5 reps -  15 sec hold - Forearm Supination Stretch  - 3-4 x daily - 3-5 reps - 15 sec hold - Wrist Prayer Stretch  - 4 x daily - 3-5 reps - 15 sec hold   PATIENT EDUCATION: Education details: See tx section above for details  Person educated: Patient Education method: Verbal Instruction, Teach back, Handouts  Education comprehension: States and demonstrates understanding, Additional Education required    HOME EXERCISE PROGRAM: Access Code: [email or web address] URL: [email or web address] Date: 11/10/2023 Prepared by: Melvenia Ada   GOALS: Goals reviewed with patient? Yes   SHORT TERM GOALS: (STG required if POC>30 days) Target Date: 11/27/23  Pt will demo/state understanding of initial HEP to improve pain levels and prerequisite motion. Goal status: INITIAL   LONG TERM GOALS: Target Date: 12/25/23  Pt will improve functional ability by decreased impairment per PSFS assessment from 3.3 to 6.5 or better, for better quality of life. Goal status: INITIAL  2.  Pt will improve grip strength in left hand from 35 lbs to at least 50 lbs for functional use at home and in IADLs. Goal status: INITIAL  3.  Pt will improve A/ROM in left elbow extension from -8 degrees to at least neutral/straight, to have functional motion for tasks like reach and grasp.  Goal status: INITIAL  4.  Pt will improve strength in left elbow flexion/extension from unsafe to test and apparent 3/5  MMT to at least 4+/5 MMT to have increased functional ability to carry out selfcare and higher-level homecare tasks with less difficulty. Goal status: INITIAL  5.  Pt will improve coordination skills in left hand and arm, as seen by improved score on box and blocks testing (at least 65 blocks ) to have increased functional ability to carry out fine motor tasks (fasteners, etc.) and more complex, coordinated IADLs (meal prep, sports, etc.).  Goal status: INITIAL  6.  Pt will decrease pain at worst from 3/10 to 1/10 or better to have better sleep and occupational participation in daily roles. Goal status: INITIAL   ASSESSMENT:  CLINICAL IMPRESSION: 11/27/23: ***She is tolerating manual therapy and stretches better than previously.  She is making improvements.  Eval: Patient is a 58 y.o. female who was seen today for occupational therapy evaluation for stiffness, weakness, decreased movement and functional ability in the left left elbow and wrist after a fall and subsequent elbow fracture.  The patient will benefit from outpatient occupational therapy to decrease symptoms, improve functional upper extremity use, and increase quality of life.    PLAN:  OT FREQUENCY: 1-2x/week  OT DURATION: 6 weeks through 12/25/2023 and up to 7 total visits as needed   PLANNED INTERVENTIONS: 97535 self care/ADL training, 02889 therapeutic exercise, 97530 therapeutic activity, 97112 neuromuscular re-education, 97140 manual therapy, 97035 ultrasound, 97032 electrical stimulation (manual), 97760 Orthotic Initial, [id number] Orthotic/Prosthetic subsequent, compression bandaging, Dry needling, energy conservation, coping strategies training, and patient/family education  RECOMMENDED OTHER SERVICES: none now    CONSULTED AND AGREED WITH PLAN OF CARE: Patient  PLAN FOR NEXT SESSION:   ***try to upgrade stretches as able, and in an additional 1 to 2 weeks, start light grip and isometric elbow strength  training.   Melvenia Ada, OTR/L, CHT  11/26/2023, 3:20 PM

## 2023-11-27 ENCOUNTER — Telehealth: Payer: Self-pay | Admitting: Rehabilitative and Restorative Service Providers"

## 2023-11-27 ENCOUNTER — Encounter: Admitting: Rehabilitative and Restorative Service Providers"

## 2023-11-27 NOTE — Telephone Encounter (Signed)
 OT called patient to discuss missed appointment. OT could not leave her message because she does not have a voicemail that is set up and it went directly to her voicemail.   If she does not show up for an additional therapy session (no call no-show) she will be discharged per policy.

## 2023-12-03 NOTE — Therapy (Incomplete)
 OUTPATIENT OCCUPATIONAL THERAPY TREATMENT NOTE  Patient Name: Susan Berry MRN: [id number] DOB:[birth year], 58 y.o., female Today's Date: 12/03/2023  PCP: Pura CORDOBA MD REFERRING PROVIDER:  Emiliano Leonce CROME, PA-C    END OF SESSION:    Past Medical History:  Diagnosis Date   Asthma    last use of inhaler 5 yrs ago   Enlarged thyroid  gland    left side - f/u with md yearly   SVD (spontaneous vaginal delivery)    x 4   TIA (transient ischemic attack)    Past Surgical History:  Procedure Laterality Date   ABLATION     BREAST BIOPSY Left 02/27/2022   US  LT BREAST BX W LOC DEV 1ST LESION IMG BX SPEC US  GUIDE 02/27/2022 [facility]   BREAST BIOPSY  04/03/2022   MM LT RADIOACTIVE SEED LOC MAMMO GUIDE 04/03/2022 [facility]   BREAST CYST EXCISION Left 30 yrs ago   left   BREAST LUMPECTOMY WITH RADIOACTIVE SEED LOCALIZATION Left 04/04/2022   Procedure: LEFT BREAST LUMPECTOMY WITH RADIOACTIVE SEED LOCALIZATION;  Surgeon: Curvin Deward MOULD, MD;  Location: [facility];  Service: General;  Laterality: Left;   EYE SURGERY     left eye surgery   HYSTEROSCOPY WITH D & C N/A 11/24/2012   Procedure: DILATATION AND CURETTAGE /HYSTEROSCOPY;  Surgeon: Aida DELENA Na, MD;  Location: [facility];  Service: Gynecology;  Laterality: N/A;   repair of vaginal tear   1986   after SVD   TUBAL LIGATION     Patient Active Problem List   Diagnosis Date Noted   Left arm pain 10/16/2023   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  Rationale for Evaluation and Treatment: Rehabilitation  PERTINENT HISTORY: Xray (left wrist 4 views, left elbow 3 views): No evidence of acute fracture or bony injury within the left wrist, which does show positive ulnar variance.  There does appear to be a nondisplaced radial head fracture that was not visible on prior radiographs on 8/25.   She states having some pain with trying to extend her arm at the elbow.  She was also having some wrist pain but was given a prefabricated wrist brace and after wearing it for a week her pain is much better.  She will continue to wear it at night as needed. She is a Energy manager and LHD.     PRECAUTIONS: None  RED FLAGS: None   WEIGHT BEARING RESTRICTIONS: Yes recommended less than 10 pounds or anything that hurts in the left arm and elbow now and for the next 3 to 4 weeks.   SUBJECTIVE:   SUBJECTIVE STATEMENT: Now 7 weeks s/p Lt radial head fx, after a fall on her left dominant arm. She states ***.       PAIN:  Are you having pain? Yes: NPRS scale: *** 0/10, up to a 3/10 when extending her elbow Pain location: Lt elbow  Pain description: Slightly aching, sometimes more sore Aggravating factors: Lifting Relieving factors: Rest   PATIENT  GOALS: To improve pain, strength, mobility in the left dominant arm  NEXT MD VISIT: As needed   OBJECTIVE: (All objective assessments below are from initial evaluation on: 11/10/23 unless otherwise specified.)   HAND DOMINANCE: LEFT  ADLs: Overall ADLs: States decreased ability to grab, hold household objects, pain and difficulty to open containers, perform FMS tasks (manipulate fasteners on clothing), mild to moderate bathing problems as well.    FUNCTIONAL OUTCOME MEASURES: Eval: Patient Specific Functional Scale: 3.3 (typing, mopping, lifting heavy pans)  (Higher Score  =  Better Ability for the Selected Tasks)      UPPER EXTREMITY ROM     Shoulder to Wrist AROM Left eval  Lt 12/04/23  Shoulder flexion    Shoulder abduction    Shoulder extension    Shoulder internal rotation    Shoulder external rotation    Elbow flexion 153 ***  Elbow extension (-8) ***  Forearm supination 52 ***  Forearm pronation  85 ***  Wrist flexion 73 ***  Wrist extension 60 ***  Wrist ulnar deviation    Wrist radial deviation    (Blank rows = not tested)   Hand AROM Left eval  Full Fist Ability (or Gap to Distal Palmar Crease) WNL  Thumb Opposition  (Kapandji Scale)  WNL  (Blank rows = not tested)   UPPER EXTREMITY MMT:    Eval:  NT at eval due to recent and still healing injuries. Will be tested when appropriate.   MMT Left TBD  Shoulder flexion   Shoulder abduction   Shoulder adduction   Shoulder extension   Shoulder internal rotation   Shoulder external rotation   Middle trapezius   Lower trapezius   Elbow flexion   Elbow extension   Forearm supination   Forearm pronation   Wrist flexion   Wrist extension   Wrist ulnar deviation   Wrist radial deviation   (Blank rows = not tested)  HAND FUNCTION: Eval: Observed weakness in affected Lt hand.  Grip strength Right: 62 lbs, Left: 35 lbs   COORDINATION: Eval: Observed mild coordination impairments with affected Lt hand. Box and Blocks Test: 57 Blocks today (56 is WFL, 74 is AVG)  OBSERVATIONS:   Eval: Radial head area still slightly tender, only stiff at end ranges, TFCC area of the wrist not overly tender today though the wrist is slightly tight in extension.  No overt signs of thumb CMC arthritis and no complaints of that.   TODAY'S TREATMENT:  12/04/23: She starts with active range of motion for exercise as well as new measures which shows ***.  OT then puts moist heat around her elbow while we review her home exercise program.  After manual therapy IASTM and massage to the tight adjacent muscle groups, we then perform her stretches together, her tolerating them fairly well.  OT also does light  manual therapy joint mobilizations at the radial head and forearm to encourage motion.  OT also educates on new progressive resistive exercises including isometric grip training as well as strengthening for the bicep, tricep, forearm.  These were done carefully and as tolerated-she was told to always warm up and stretch before doing these.  She should not be using any kind of weight that is so heavy that it hurts.  At the end of the session she states understanding all directions and exercise program and has no significant pain as she leaves. ***       Exercises Reviewed/performed today: - Triceps Stretch- Do on back  of chair   - 3-4 x daily - 3-5 reps - 15 hold - elbow flexion STRETCH  - 3-4 x daily - 3-5 reps - 15 sec hold - Seated Elbow PROM Blocked Extension  - 3-4 x daily - 3 reps - 20 sec hold - Fridge Door Stretch  - 4 x daily - 3-5 reps - 15 sec hold - Forearm Supination Stretch  - 3-4 x daily - 3-5 reps - 15 sec hold - Wrist Prayer Stretch  - 4 x daily - 3-5 reps - 15 sec hold ***  PATIENT EDUCATION: Education details: See tx section above for details  Person educated: Patient Education method: Verbal Instruction, Teach back, Handouts  Education comprehension: States and demonstrates understanding, Additional Education required    HOME EXERCISE PROGRAM: Access Code: [email or web address] URL: [email or web address] Date: 11/10/2023 Prepared by: Melvenia Ada   GOALS: Goals reviewed with patient? Yes   SHORT TERM GOALS: (STG required if POC>30 days) Target Date: 11/27/23  Pt will demo/state understanding of initial HEP to improve pain levels and prerequisite motion. Goal status: INITIAL   LONG TERM GOALS: Target Date: 12/25/23  Pt will improve functional ability by decreased impairment per PSFS assessment from 3.3 to 6.5 or better, for better quality of life. Goal status: INITIAL  2.  Pt will improve grip strength in left hand from 35 lbs to at least 50 lbs for  functional use at home and in IADLs. Goal status: INITIAL  3.  Pt will improve A/ROM in left elbow extension from -8 degrees to at least neutral/straight, to have functional motion for tasks like reach and grasp.  Goal status: INITIAL  4.  Pt will improve strength in left elbow flexion/extension from unsafe to test and apparent 3/5 MMT to at least 4+/5 MMT to have increased functional ability to carry out selfcare and higher-level homecare tasks with less difficulty. Goal status: INITIAL  5.  Pt will improve coordination skills in left hand and arm, as seen by improved score on box and blocks testing (at least 65 blocks ) to have increased functional ability to carry out fine motor tasks (fasteners, etc.) and more complex, coordinated IADLs (meal prep, sports, etc.).  Goal status: INITIAL  6.  Pt will decrease pain at worst from 3/10 to 1/10 or better to have better sleep and occupational participation in daily roles. Goal status: INITIAL   ASSESSMENT:  CLINICAL IMPRESSION: 12/04/23: ***She is tolerating manual therapy and stretches better than previously.  She is making improvements.  Eval: Patient is a 58 y.o. female who was seen today for occupational therapy evaluation for stiffness, weakness, decreased movement and functional ability in the left left elbow and wrist after a fall and subsequent elbow fracture.  The patient will benefit from outpatient occupational therapy to decrease symptoms, improve functional upper extremity use, and increase quality of life.    PLAN:  OT FREQUENCY: 1-2x/week  OT DURATION: 6 weeks through 12/25/2023 and up to 7 total visits as needed   PLANNED INTERVENTIONS: 97535 self care/ADL training, 02889 therapeutic exercise, 97530 therapeutic activity, 97112 neuromuscular re-education, 97140 manual therapy, 97035 ultrasound, 97032 electrical stimulation (manual), 97760 Orthotic Initial, [id number] Orthotic/Prosthetic subsequent, compression bandaging, Dry  needling, energy conservation, coping strategies training, and patient/family education  RECOMMENDED OTHER SERVICES: none now    CONSULTED AND AGREED WITH PLAN OF CARE: Patient  PLAN FOR NEXT SESSION:   ***try to upgrade stretches as able, start light elbow strength training   Melvenia Ada, OTR/L, CHT  12/03/2023,  12:24 PM

## 2023-12-04 ENCOUNTER — Encounter: Admitting: Rehabilitative and Restorative Service Providers"

## 2023-12-04 ENCOUNTER — Telehealth: Payer: Self-pay | Admitting: Rehabilitative and Restorative Service Providers"

## 2023-12-04 NOTE — Telephone Encounter (Signed)
 OT called patient to discuss missed appointment. OT spoke with pt about her no-show appointment today, and asked why she did cancel her last appointment as well.  She states that she is feeling very good, and she is going on a cruise for a week or more.  She states that she does not need any more therapy, that she is feeling fine.    OT will discharge her today per her request.

## 2023-12-18 ENCOUNTER — Encounter: Admitting: Rehabilitative and Restorative Service Providers"

## 2023-12-21 ENCOUNTER — Encounter: Payer: Self-pay | Admitting: Radiology

## 2023-12-25 ENCOUNTER — Encounter: Admitting: Rehabilitative and Restorative Service Providers"
